# Patient Record
Sex: Male | Born: 1960
Health system: Southern US, Community
[De-identification: ages and names within clinical notes are randomized; demographics above are authoritative.]

## PROBLEM LIST (undated history)

## (undated) DIAGNOSIS — Z87442 Personal history of urinary calculi: Secondary | ICD-10-CM

## (undated) DIAGNOSIS — I4819 Other persistent atrial fibrillation: Secondary | ICD-10-CM

## (undated) DIAGNOSIS — E782 Mixed hyperlipidemia: Secondary | ICD-10-CM

## (undated) DIAGNOSIS — I2510 Atherosclerotic heart disease of native coronary artery without angina pectoris: Secondary | ICD-10-CM

## (undated) DIAGNOSIS — F419 Anxiety disorder, unspecified: Secondary | ICD-10-CM

## (undated) HISTORY — PX: COLONOSCOPY: SHX174

## (undated) HISTORY — DX: Atherosclerotic heart disease of native coronary artery without angina pectoris: I25.10

## (undated) HISTORY — DX: Mixed hyperlipidemia: E78.2

## (undated) HISTORY — PX: CARDIAC CATHETERIZATION: SHX172

## (undated) HISTORY — PX: OTHER SURGICAL HISTORY: SHX169

## (undated) HISTORY — PX: POLYPECTOMY: SHX149

## (undated) HISTORY — PX: CORONARY ARTERY BYPASS GRAFT: SHX141

## (undated) HISTORY — DX: Other persistent atrial fibrillation: I48.19

## (undated) HISTORY — DX: Personal history of urinary calculi: Z87.442

---

## 1999-03-10 ENCOUNTER — Encounter: Payer: Self-pay | Admitting: Orthopedic Surgery

## 1999-03-10 ENCOUNTER — Ambulatory Visit (HOSPITAL_COMMUNITY): Admission: RE | Admit: 1999-03-10 | Discharge: 1999-03-10 | Payer: Self-pay | Admitting: Orthopedic Surgery

## 2001-09-03 ENCOUNTER — Emergency Department (HOSPITAL_COMMUNITY): Admission: EM | Admit: 2001-09-03 | Discharge: 2001-09-03 | Payer: Self-pay | Admitting: Emergency Medicine

## 2001-09-03 ENCOUNTER — Encounter: Payer: Self-pay | Admitting: Emergency Medicine

## 2001-09-05 ENCOUNTER — Ambulatory Visit (HOSPITAL_BASED_OUTPATIENT_CLINIC_OR_DEPARTMENT_OTHER): Admission: RE | Admit: 2001-09-05 | Discharge: 2001-09-05 | Payer: Self-pay | Admitting: Urology

## 2001-09-05 ENCOUNTER — Encounter: Payer: Self-pay | Admitting: Urology

## 2004-10-06 ENCOUNTER — Ambulatory Visit: Payer: Self-pay | Admitting: Internal Medicine

## 2004-10-13 ENCOUNTER — Ambulatory Visit: Payer: Self-pay | Admitting: Internal Medicine

## 2004-12-06 ENCOUNTER — Ambulatory Visit: Payer: Self-pay | Admitting: Internal Medicine

## 2006-05-31 ENCOUNTER — Ambulatory Visit: Payer: Self-pay | Admitting: Family Medicine

## 2006-07-30 ENCOUNTER — Ambulatory Visit: Payer: Self-pay | Admitting: Internal Medicine

## 2006-08-17 ENCOUNTER — Emergency Department (HOSPITAL_COMMUNITY): Admission: EM | Admit: 2006-08-17 | Discharge: 2006-08-17 | Payer: Self-pay | Admitting: Family Medicine

## 2007-01-29 ENCOUNTER — Encounter: Payer: Self-pay | Admitting: Internal Medicine

## 2007-03-14 LAB — CONVERTED CEMR LAB
Bilirubin, Direct: 0.2 mg/dL (ref 0.0–0.3)
Chol/HDL Ratio, serum: 3.9
HDL: 34.9 mg/dL — ABNORMAL LOW (ref >39.0)
LDL Cholesterol: 89 mg/dL (ref 0–99)
Triglyceride fasting, serum: 67 mg/dL (ref 0–149)
VLDL: 13 mg/dL (ref 0–40)

## 2007-08-21 ENCOUNTER — Telehealth: Payer: Self-pay | Admitting: Internal Medicine

## 2007-08-29 ENCOUNTER — Ambulatory Visit: Payer: Self-pay | Admitting: Internal Medicine

## 2007-08-29 DIAGNOSIS — Z87442 Personal history of urinary calculi: Secondary | ICD-10-CM

## 2007-08-29 DIAGNOSIS — E782 Mixed hyperlipidemia: Secondary | ICD-10-CM

## 2007-08-29 HISTORY — DX: Mixed hyperlipidemia: E78.2

## 2007-08-29 HISTORY — DX: Personal history of urinary calculi: Z87.442

## 2007-08-29 LAB — CONVERTED CEMR LAB
Cholesterol, target level: 200 mg/dL
HDL goal, serum: 40 mg/dL

## 2007-08-30 LAB — CONVERTED CEMR LAB
ALT: 60 units/L — ABNORMAL HIGH (ref 0–53)
AST: 26 units/L (ref 0–37)
Bilirubin, Direct: 0.2 mg/dL (ref 0.0–0.3)
CO2: 29 meq/L (ref 19–32)
Calcium: 10.4 mg/dL (ref 8.4–10.5)
Chloride: 104 meq/L (ref 96–112)
Cholesterol: 172 mg/dL (ref 0–200)
GFR calc non Af Amer: 96 mL/min
Glucose, Bld: 93 mg/dL (ref 70–99)
LDL Cholesterol: 112 mg/dL — ABNORMAL HIGH (ref 0–99)

## 2007-09-02 ENCOUNTER — Telehealth (INDEPENDENT_AMBULATORY_CARE_PROVIDER_SITE_OTHER): Payer: Self-pay | Admitting: *Deleted

## 2007-10-21 ENCOUNTER — Encounter: Payer: Self-pay | Admitting: Internal Medicine

## 2008-08-14 DIAGNOSIS — I2510 Atherosclerotic heart disease of native coronary artery without angina pectoris: Secondary | ICD-10-CM

## 2008-08-14 HISTORY — DX: Atherosclerotic heart disease of native coronary artery without angina pectoris: I25.10

## 2008-09-21 ENCOUNTER — Ambulatory Visit: Payer: Self-pay | Admitting: Internal Medicine

## 2008-09-22 LAB — CONVERTED CEMR LAB
ALT: 30 units/L (ref 0–53)
Bilirubin, Direct: 0.1 mg/dL (ref 0.0–0.3)
LDL Cholesterol: 89 mg/dL (ref 0–99)
Total Bilirubin: 1 mg/dL (ref 0.3–1.2)
Total CHOL/HDL Ratio: 3.4
VLDL: 9 mg/dL (ref 0–40)

## 2009-02-17 ENCOUNTER — Telehealth: Payer: Self-pay | Admitting: Internal Medicine

## 2009-03-04 ENCOUNTER — Ambulatory Visit: Payer: Self-pay | Admitting: Internal Medicine

## 2009-03-04 ENCOUNTER — Ambulatory Visit: Payer: Self-pay | Admitting: Family Medicine

## 2009-03-05 LAB — CONVERTED CEMR LAB
Albumin: 3.8 g/dL (ref 3.5–5.2)
HDL: 37.7 mg/dL — ABNORMAL LOW (ref >39.00)
LDL Cholesterol: 73 mg/dL (ref 0–99)
Total CHOL/HDL Ratio: 3
Triglycerides: 102 mg/dL (ref 0.0–149.0)
VLDL: 20.4 mg/dL (ref 0.0–40.0)

## 2009-05-20 ENCOUNTER — Ambulatory Visit: Payer: Self-pay | Admitting: Internal Medicine

## 2009-05-20 LAB — CONVERTED CEMR LAB
Albumin: 4.4 g/dL (ref 3.5–5.2)
Alkaline Phosphatase: 52 units/L (ref 39–117)
Basophils Absolute: 0 10*3/uL (ref 0.0–0.1)
Bilirubin Urine: NEGATIVE
CO2: 30 meq/L (ref 19–32)
Calcium: 9.4 mg/dL (ref 8.4–10.5)
Chloride: 103 meq/L (ref 96–112)
Creatinine, Ser: 0.9 mg/dL (ref 0.4–1.5)
Eosinophils Absolute: 0.1 10*3/uL (ref 0.0–0.7)
Glucose, Bld: 84 mg/dL (ref 70–99)
Glucose, Urine, Semiquant: NEGATIVE
HDL: 43.2 mg/dL (ref >39.00)
Hemoglobin: 16.2 g/dL (ref 13.0–17.0)
Ketones, urine, test strip: NEGATIVE
Lymphocytes Relative: 28.2 % (ref 12.0–46.0)
MCHC: 33.7 g/dL (ref 30.0–36.0)
MCV: 91.1 fL (ref 78.0–100.0)
Monocytes Absolute: 0.8 10*3/uL (ref 0.1–1.0)
Neutro Abs: 3.3 10*3/uL (ref 1.4–7.7)
Neutrophils Relative %: 55.6 % (ref 43.0–77.0)
PSA: 0.55 ng/mL (ref 0.10–4.00)
RDW: 12.4 % (ref 11.5–14.6)
Specific Gravity, Urine: 1.02
Total Protein: 7.2 g/dL (ref 6.0–8.3)
Triglycerides: 127 mg/dL (ref 0.0–149.0)
pH: 6.5

## 2009-06-03 ENCOUNTER — Ambulatory Visit: Payer: Self-pay | Admitting: Internal Medicine

## 2009-06-03 DIAGNOSIS — R079 Chest pain, unspecified: Secondary | ICD-10-CM | POA: Insufficient documentation

## 2009-06-07 ENCOUNTER — Telehealth (INDEPENDENT_AMBULATORY_CARE_PROVIDER_SITE_OTHER): Payer: Self-pay | Admitting: *Deleted

## 2009-06-08 ENCOUNTER — Ambulatory Visit: Payer: Self-pay | Admitting: Cardiovascular Disease

## 2009-06-08 ENCOUNTER — Encounter: Payer: Self-pay | Admitting: Cardiovascular Disease

## 2009-06-08 ENCOUNTER — Encounter (INDEPENDENT_AMBULATORY_CARE_PROVIDER_SITE_OTHER): Payer: Self-pay | Admitting: *Deleted

## 2009-06-08 ENCOUNTER — Ambulatory Visit: Payer: Self-pay

## 2009-06-08 ENCOUNTER — Encounter (HOSPITAL_COMMUNITY): Admission: RE | Admit: 2009-06-08 | Discharge: 2009-08-11 | Payer: Self-pay | Admitting: Internal Medicine

## 2009-06-08 LAB — CONVERTED CEMR LAB
Basophils Absolute: 0 10*3/uL (ref 0.0–0.1)
Basophils Relative: 0.2 % (ref 0.0–3.0)
Calcium: 9.4 mg/dL (ref 8.4–10.5)
Eosinophils Relative: 1.8 % (ref 0.0–5.0)
GFR calc non Af Amer: 108.6 mL/min (ref >60)
Glucose, Bld: 88 mg/dL (ref 70–99)
HCT: 47.8 % (ref 39.0–52.0)
Hemoglobin: 16.1 g/dL (ref 13.0–17.0)
Lymphocytes Relative: 32 % (ref 12.0–46.0)
Lymphs Abs: 1.8 10*3/uL (ref 0.7–4.0)
Monocytes Relative: 11.3 % (ref 3.0–12.0)
Neutro Abs: 3 10*3/uL (ref 1.4–7.7)
Potassium: 4.1 meq/L (ref 3.5–5.1)
RBC: 5.23 M/uL (ref 4.22–5.81)
Sodium: 139 meq/L (ref 135–145)
WBC: 5.5 10*3/uL (ref 4.5–10.5)

## 2009-06-09 ENCOUNTER — Encounter: Payer: Self-pay | Admitting: Cardiovascular Disease

## 2009-06-09 ENCOUNTER — Ambulatory Visit (HOSPITAL_COMMUNITY): Admission: RE | Admit: 2009-06-09 | Discharge: 2009-06-09 | Payer: Self-pay | Admitting: Cardiovascular Disease

## 2009-06-15 ENCOUNTER — Ambulatory Visit: Payer: Self-pay | Admitting: Cardiothoracic Surgery

## 2009-06-16 ENCOUNTER — Ambulatory Visit (HOSPITAL_COMMUNITY): Admission: RE | Admit: 2009-06-16 | Discharge: 2009-06-16 | Payer: Self-pay | Admitting: Cardiothoracic Surgery

## 2009-06-16 ENCOUNTER — Telehealth (INDEPENDENT_AMBULATORY_CARE_PROVIDER_SITE_OTHER): Payer: Self-pay | Admitting: *Deleted

## 2009-06-16 ENCOUNTER — Encounter: Payer: Self-pay | Admitting: Cardiothoracic Surgery

## 2009-06-17 ENCOUNTER — Telehealth: Payer: Self-pay | Admitting: Cardiovascular Disease

## 2009-06-18 ENCOUNTER — Ambulatory Visit: Payer: Self-pay | Admitting: Cardiothoracic Surgery

## 2009-06-18 ENCOUNTER — Inpatient Hospital Stay (HOSPITAL_COMMUNITY): Admission: RE | Admit: 2009-06-18 | Discharge: 2009-06-21 | Payer: Self-pay | Admitting: Cardiothoracic Surgery

## 2009-06-28 ENCOUNTER — Ambulatory Visit: Payer: Self-pay | Admitting: Cardiovascular Disease

## 2009-06-28 DIAGNOSIS — I959 Hypotension, unspecified: Secondary | ICD-10-CM | POA: Insufficient documentation

## 2009-06-28 DIAGNOSIS — I319 Disease of pericardium, unspecified: Secondary | ICD-10-CM | POA: Insufficient documentation

## 2009-07-02 ENCOUNTER — Ambulatory Visit: Payer: Self-pay

## 2009-07-02 ENCOUNTER — Encounter: Payer: Self-pay | Admitting: Cardiovascular Disease

## 2009-07-02 ENCOUNTER — Encounter (INDEPENDENT_AMBULATORY_CARE_PROVIDER_SITE_OTHER): Payer: Self-pay | Admitting: *Deleted

## 2009-07-02 ENCOUNTER — Ambulatory Visit (HOSPITAL_COMMUNITY): Admission: RE | Admit: 2009-07-02 | Discharge: 2009-07-02 | Payer: Self-pay | Admitting: Cardiovascular Disease

## 2009-07-02 ENCOUNTER — Ambulatory Visit: Payer: Self-pay | Admitting: Cardiology

## 2009-07-06 ENCOUNTER — Encounter (HOSPITAL_COMMUNITY): Admission: RE | Admit: 2009-07-06 | Discharge: 2009-08-13 | Payer: Self-pay | Admitting: Cardiovascular Disease

## 2009-07-06 ENCOUNTER — Encounter: Payer: Self-pay | Admitting: Cardiovascular Disease

## 2009-07-12 ENCOUNTER — Encounter (INDEPENDENT_AMBULATORY_CARE_PROVIDER_SITE_OTHER): Payer: Self-pay | Admitting: *Deleted

## 2009-07-12 ENCOUNTER — Encounter: Payer: Self-pay | Admitting: Cardiovascular Disease

## 2009-07-12 ENCOUNTER — Ambulatory Visit: Payer: Self-pay | Admitting: Cardiothoracic Surgery

## 2009-07-12 ENCOUNTER — Encounter: Admission: RE | Admit: 2009-07-12 | Discharge: 2009-07-12 | Payer: Self-pay | Admitting: Cardiothoracic Surgery

## 2009-07-19 ENCOUNTER — Telehealth: Payer: Self-pay | Admitting: Cardiovascular Disease

## 2009-07-21 ENCOUNTER — Encounter: Payer: Self-pay | Admitting: Cardiovascular Disease

## 2009-08-14 ENCOUNTER — Encounter (HOSPITAL_COMMUNITY): Admission: RE | Admit: 2009-08-14 | Discharge: 2009-11-12 | Payer: Self-pay | Admitting: Cardiovascular Disease

## 2009-08-19 ENCOUNTER — Encounter (INDEPENDENT_AMBULATORY_CARE_PROVIDER_SITE_OTHER): Payer: Self-pay | Admitting: *Deleted

## 2009-08-19 ENCOUNTER — Ambulatory Visit: Payer: Self-pay | Admitting: Cardiovascular Disease

## 2009-08-19 LAB — CONVERTED CEMR LAB
Albumin: 3.9 g/dL (ref 3.5–5.2)
Alkaline Phosphatase: 58 units/L (ref 39–117)
Cholesterol: 116 mg/dL (ref 0–200)
HDL: 40.9 mg/dL (ref >39.00)
LDL Cholesterol: 61 mg/dL (ref 0–99)
Total CHOL/HDL Ratio: 3
Total Protein: 6.7 g/dL (ref 6.0–8.3)
Triglycerides: 69 mg/dL (ref 0.0–149.0)

## 2009-08-23 ENCOUNTER — Encounter: Payer: Self-pay | Admitting: Cardiovascular Disease

## 2009-09-23 ENCOUNTER — Telehealth: Payer: Self-pay | Admitting: Cardiovascular Disease

## 2009-10-11 ENCOUNTER — Telehealth: Payer: Self-pay | Admitting: Cardiovascular Disease

## 2009-10-12 ENCOUNTER — Encounter (INDEPENDENT_AMBULATORY_CARE_PROVIDER_SITE_OTHER): Payer: Self-pay | Admitting: *Deleted

## 2009-11-02 ENCOUNTER — Ambulatory Visit: Payer: Self-pay

## 2009-11-02 ENCOUNTER — Telehealth: Payer: Self-pay | Admitting: *Deleted

## 2009-11-02 ENCOUNTER — Ambulatory Visit: Payer: Self-pay | Admitting: Cardiovascular Disease

## 2010-01-13 ENCOUNTER — Encounter: Payer: Self-pay | Admitting: Cardiovascular Disease

## 2010-01-26 ENCOUNTER — Ambulatory Visit: Payer: Self-pay | Admitting: Cardiovascular Disease

## 2010-01-28 ENCOUNTER — Encounter (INDEPENDENT_AMBULATORY_CARE_PROVIDER_SITE_OTHER): Payer: Self-pay | Admitting: *Deleted

## 2010-01-28 LAB — CONVERTED CEMR LAB
ALT: 18 units/L (ref 0–53)
AST: 19 units/L (ref 0–37)
Bilirubin, Direct: 0.2 mg/dL (ref 0.0–0.3)
Cholesterol: 143 mg/dL (ref 0–200)
HDL: 56.5 mg/dL (ref >39.00)
Total Bilirubin: 0.9 mg/dL (ref 0.3–1.2)
Total Protein: 6.7 g/dL (ref 6.0–8.3)
VLDL: 10.4 mg/dL (ref 0.0–40.0)

## 2010-04-08 ENCOUNTER — Ambulatory Visit: Payer: Self-pay | Admitting: Internal Medicine

## 2010-04-08 DIAGNOSIS — J069 Acute upper respiratory infection, unspecified: Secondary | ICD-10-CM | POA: Insufficient documentation

## 2010-05-31 ENCOUNTER — Ambulatory Visit: Payer: Self-pay | Admitting: Cardiovascular Disease

## 2010-08-01 ENCOUNTER — Telehealth: Payer: Self-pay | Admitting: Internal Medicine

## 2010-09-15 NOTE — Letter (Signed)
Summary: [facility]  [facility]   Imported By: Kassie Mends 08/30/2009 08:50:31  _____________________________________________________________________  External Attachment:    Type:   Image     Comment:   External Document

## 2010-09-15 NOTE — Progress Notes (Signed)
Summary: Pt [facility] Note Refill Request Call back at [phone] Ann's cell Message from:  spouse Dewayne Hatch on August 01, 2010 2:31 PM  Refills Requested: Medication #1:  LIPITOR 10 MG  TABS Take 1 tablet by mouth at bedtime   Dosage confirmed as above?Dosage Confirmed Pt has been getting this through [facility], but pt would like to get this called in to local pharmacy. [facility]s [facility] in to [facility].      Method Requested: [facility]  Initial call taken by: Lucy Antigua,  August 01, 2010 2:30 PM  Follow-up for Phone Call        Rx called to pharmacy Follow-up by: Alfred Levins, CMA,  August 01, 2010 3:39 PM    Prescriptions: LIPITOR 10 MG  TABS (ATORVASTATIN CALCIUM) Take 1 tablet by mouth at bedtime  #90 x 2   Entered by:   Alfred Levins, CMA   Authorized by:   Birdie Sons MD   Signed by:   Alfred Levins, CMA on 08/01/2010   Method used:   Electronically to        [facility] [address] [phone]* (retail)       [address]       [place], [place]  [address]       Ph: [phone] or [phone]       Fax: [phone]   RxID:   [id number]

## 2010-09-15 NOTE — Miscellaneous (Signed)
Summary: [facility]   [facility]   Imported By: Roderic Ovens 01/31/2010 15:49:08  _____________________________________________________________________  External Attachment:    Type:   Image     Comment:   External Document

## 2010-09-15 NOTE — Miscellaneous (Signed)
Summary: [facility]   [facility]   Imported By: Roderic Ovens 09/02/2009 13:43:47  _____________________________________________________________________  External Attachment:    Type:   Image     Comment:   External Document

## 2010-09-15 NOTE — Assessment & Plan Note (Signed)
Summary: 6 month rov/sl   Visit Type:  Follow-up  CC:  NO COMPLAINTS AT THIS TIME.  History of Present Illness: Travis Cook is seen today post CABG by Dr Tyrone Sage.  He is doing well.  His BP and HR always run low and I think we can stop his BB all together now.  He has been taking an aspirin.  His sternum has healed well.  He is not having angina, palpitations, dyspnea or edema.  F/U ETT 3/11 showed normal BP and HR response.  Lipids followed by Dr Cato Mulligan have been good with normal LFTS'  He has a lot of traveling to do this month.    Current Problems (verified): 1)  Uri  (ICD-465.9) 2)  Encounter For Long-term Use of Other Medications  (ICD-V58.69) 3)  Low Blood Pressure  (ICD-458.9) 4)  Pericardial Effusion  (ICD-423.9) 5)  Hyperlipidemia  (ICD-272.2) 6)  Chest Pain  (ICD-786.50) 7)  Preventive Health Care  (ICD-V70.0) 8)  Nephrolithiasis, Hx of  (ICD-V13.01) 9)  Pre-operative Cardiovascular Examination  (ICD-V72.81)  Current Medications (verified): 1)  Lipitor 10 Mg  Tabs (Atorvastatin Calcium) .... Take 1 Tablet By Mouth At Bedtime 2)  Aspirin Ec 325 Mg Tbec (Aspirin) .... Take One Tablet By Mouth Daily 3)  Tylenol 325 Mg Tabs (Acetaminophen) .... 4 Times Daily As Needed 4)  Zolpidem Tartrate 5 Mg  Tabs (Zolpidem Tartrate) .[name].. 1 By Mouth At Night As Needed  Allergies (verified): No Known Drug Allergies  Past History:  Past Medical History: Last updated: 06/23/2009 Current Problems:  HYPERLIPIDEMIA (ICD-272.2) Angina:  CABG 06/2009 PREVENTIVE HEALTH CARE (ICD-V70.0) NEPHROLITHIASIS, HX OF (ICD-V13.01), hx of--lithotripsy PRE-OPERATIVE CARDIOVASCULAR EXAMINATION (ICD-V72.81)  Past Surgical History: Last updated: 06/23/2009 CABG:  06/21/2009  [name]   Coronary artery bypass grafting x3 using left internal mammary artery to left anterior descending coronary artery, reverse saphenous vein graft to right coronary artery, reverse saphenous vein graft to distal circumflex  coronary artery. Right thigh endovein harvesting done.   ?  meningitis--not diagnostic  Status post hernia repair as an infant.    Status post vocal cord polyp removal.      Family History: Last updated: 08/29/2007 father alive--MI age 22 mother A & W, healthy 3 brothers, 1 sister all A & W  Social History: Last updated: 08/29/2007 Married Never Smoked Regular exercise-yes 4 children--one son with crohns--others healthy  Review of Systems       Denies fever, malais, weight loss, blurry vision, decreased visual acuity, cough, sputum, SOB, hemoptysis, pleuritic pain, palpitaitons, heartburn, abdominal pain, melena, lower extremity edema, claudication, or rash.   Vital Signs:  Patient profile:   50 year old male Height:      68 inches Weight:      157.50 pounds BMI:     24.03 Pulse rate:   57 / minute Pulse rhythm:   regular BP sitting:   105 / 70  (left arm) Cuff size:   regular  Vitals Entered By: Scherrie Bateman, LPN (May 31, 2010 4:09 PM)  Physical Exam  General:  Affect appropriate Healthy:  appears stated age HEENT: normal Neck supple with no adenopathy JVP normal no bruits no thyromegaly Lungs clear with no wheezing and good diaphragmatic motion Heart:  S1/S2 no murmur,rub, gallop or click PMI normal Abdomen: benighn, BS positve, no tenderness, no AAA no bruit.  No HSM or HJR Distal pulses intact with no bruits No edema Neuro non-focal Skin warm and dry    Impression & Recommendations:  Problem #  1:  LOW BLOOD PRESSURE (ICD-458.9) Liberalize salt.  BB stopped.  No evidence of dysautonomia  Problem # 2:  HYPERLIPIDEMIA (ICD-272.2) At goal with statin.  HDL much better His updated medication list for this problem includes:    Lipitor 10 Mg Tabs (Atorvastatin calcium) .[name]... Take 1 tablet by mouth at bedtime  CHOL: 143 (01/26/2010)   LDL: 76 (01/26/2010)   HDL: 56.50 (01/26/2010)   TG: 52.0 (01/26/2010) CHOL (goal): 200 (08/29/2007)   LDL  (goal): 130 (08/29/2007)   HDL (goal): 40 (08/29/2007)   TG (goal): 150 (08/29/2007)  Problem # 3:  CHEST PAIN (ICD-786.50) CAD post CABG  Normal LV continue ASA His updated medication list for this problem includes:    Aspirin Ec 325 Mg Tbec (Aspirin) .[name]... Take one tablet by mouth daily  Patient Instructions: 1)  Your physician recommends that you schedule a follow-up appointment in: year with dr Eden Emms 2)  Your physician recommends that you continue on your current medications as directed. Please refer to the Current Medication list given to you today.

## 2010-09-15 NOTE — Letter (Signed)
[facility]   Imported By: Marylou Mccoy 08/23/2009 13:41:57  _____________________________________________________________________  External Attachment:    Type:   Image     Comment:   External Document

## 2010-09-15 NOTE — Assessment & Plan Note (Signed)
Summary: 6-8wk f/u sl   CC:  follow up.  History of Present Illness: Travis Cook is seen today post CABG by Dr Tyrone Sage.  He is doing well.  His BP and HR always run low and I think we can stop his BB all together now.  He has been taking an aspirin.  His sternum has healed well.  He is not having angina, palpitations, dyspnea or edema.    Current Problems (verified): 1)  Low Blood Pressure  (ICD-458.9) 2)  Pericardial Effusion  (ICD-423.9) 3)  Hyperlipidemia  (ICD-272.2) 4)  Chest Pain  (ICD-786.50) 5)  Preventive Health Care  (ICD-V70.0) 6)  Nephrolithiasis, Hx of  (ICD-V13.01) 7)  Pre-operative Cardiovascular Examination  (ICD-V72.81)  Current Medications (verified): 1)  Lipitor 10 Mg  Tabs (Atorvastatin Calcium) .... Take 1 Tablet By Mouth At Bedtime 2)  Metoprolol Tartrate 25 Mg Tabs (Metoprolol Tartrate) .[name].. 1 Tab By Mouth Once Daily 3)  Aspirin Ec 325 Mg Tbec (Aspirin) .... Take One Tablet By Mouth Daily 4)  Tylenol 325 Mg Tabs (Acetaminophen) .... 4 Times Daily As Needed  Allergies (verified): No Known Drug Allergies  Past History:  Past Medical History: Last updated: 06/23/2009 Current Problems:  HYPERLIPIDEMIA (ICD-272.2) Angina:  CABG 06/2009 PREVENTIVE HEALTH CARE (ICD-V70.0) NEPHROLITHIASIS, HX OF (ICD-V13.01), hx of--lithotripsy PRE-OPERATIVE CARDIOVASCULAR EXAMINATION (ICD-V72.81)  Past Surgical History: Last updated: 06/23/2009 CABG:  06/21/2009  [name]   Coronary artery bypass grafting x3 using left internal mammary artery to left anterior descending coronary artery, reverse saphenous vein graft to right coronary artery, reverse saphenous vein graft to distal circumflex coronary artery. Right thigh endovein harvesting done.   ?  meningitis--not diagnostic  Status post hernia repair as an infant.    Status post vocal cord polyp removal.      Family History: Last updated: 08/29/2007 father alive--MI age 68 mother A & W, healthy 3 brothers, 1 sister  all A & W  Social History: Last updated: 08/29/2007 Married Never Smoked Regular exercise-yes 4 children--one son with crohns--others healthy  Review of Systems       Denies fever, malais, weight loss, blurry vision, decreased visual acuity, cough, sputum, SOB, hemoptysis, pleuritic pain, palpitaitons, heartburn, abdominal pain, melena, lower extremity edema, claudication, or rash.   Vital Signs:  Patient profile:   50 year old male Height:      68 inches Weight:      169 pounds BMI:     25.79 Pulse rate:   52 / minute BP sitting:   93 / 63  (left arm)  Vitals Entered By: Travis Cook (August 19, 2009 9:28 AM)  Physical Exam  General:  Affect appropriate Healthy:  appears stated age HEENT: normal Neck supple with no adenopathy JVP normal no bruits no thyromegaly Lungs clear with no wheezing and good diaphragmatic motion Heart:  S1/S2 no murmur,rub, gallop or click PMI normal Abdomen: benighn, BS positve, no tenderness, no AAA no bruit.  No HSM or HJR Distal pulses intact with no bruits No edema Neuro non-focal Skin warm and dry    Impression & Recommendations:  Problem # 1:  LOW BLOOD PRESSURE (ICD-458.9) Stop BB and keep well hydrated.  Echo showed normal LV funciton and no pericardial effusion  Problem # 2:  CHEST PAIN (ICD-786.50) S/P CABG with good recovery.   His updated medication list for this problem includes:    Metoprolol Tartrate 25 Mg Tabs (Metoprolol tartrate) .[name]... 1 tab by mouth once daily    Aspirin Ec 325 Mg  Tbec (Aspirin) .[name]... Take one tablet by mouth daily  Problem # 3:  PREVENTIVE HEALTH CARE (ICD-V70.0) F/U lipid and liver today. Patient has been primarily vegetarian since surgery.  LDL goal less than 100  Other Orders: TLB-Lipid Panel (80061-LIPID) TLB-Hepatic/Liver Function Pnl (80076-HEPATIC)  Patient Instructions: 1)  Your physician recommends that you schedule a follow-up appointment in: 6 MONTHS

## 2010-09-15 NOTE — Letter (Signed)
[facility]   Imported By: Marylou Mccoy 12/06/2009 12:39:11  _____________________________________________________________________  External Attachment:    Type:   Image     Comment:   External Document

## 2010-09-15 NOTE — Progress Notes (Signed)
Summary: pt want to set up stress test  Phone Note Call from Patient Call back at [phone]   Caller: Spouse/Anne Reason for Call: Talk to Nurse, Talk to Doctor Summary of Call: PER PT SPOUSE CALL CARDIAC REHAB ASKED THEM TO CALL AND SET UP A STRESS TEST TO SEE IF HEART RATE STAYS ABOVE 60 SO THEY CAN GIVE HIM HIS RELEASE Initial call taken by: Omer Jack,  October 11, 2009 11:33 AM  Follow-up for Phone Call        S/W Thurston Hole and per Cardiac Rehab pt needs a stress test. We will need an order from Dr. Eden Emms to proceed. She is aware that he is out of the office today and that we will pass this message on to him and his nurse for f/u.  Follow-up by: Duncan Dull, RN, BSN,  October 11, 2009 11:35 AM    Additional Follow-up for Phone Call Additional follow up Details #2::    OK to set him up for me to do a teadmill in the office Follow-up by: Colon Branch, MD, [facility],  October 11, 2009 4:51 PM

## 2010-09-15 NOTE — Letter (Signed)
Summary: [facility] - Lipid  [facility], Main Office  [address]   [place], [place] [address]   Phone: [phone]  Fax: [phone]     January 28, 2010 MRN: [id number]   Travis Cook [address] [place], [place]  [address]   Dear Mr. Sulkowski,  We have reviewed your cholesterol results.  They are as follows:     Total Cholesterol:    143 (Desirable: less than 200)       HDL  Cholesterol:     56.50  (Desirable: greater than 40 for men and 50 for women)       LDL Cholesterol:       76  (Desirable: less than 100 for low risk and less than 70 for moderate to high risk)       Triglycerides:       52.0  (Desirable: less than 150)  Our recommendations include:These numbers look good. Continue on the same medicine. Liver function is normal. Take care, Dr. Leanora Cover.    Call our office at the number listed above if you have any questions.  Lowering your LDL cholesterol is important, but it is only one of a large number of "risk factors" that may indicate that you are at risk for heart disease, stroke or other complications of hardening of the arteries.  Other risk factors include:   A.  Cigarette Smoking* B.  High Blood Pressure* C.  Obesity* D.   Low HDL Cholesterol (see yours above)* E.   Diabetes Mellitus (higher risk if your is uncontrolled) F.  Family history of premature heart disease G.  Previous history of stroke or cardiovascular disease    *These are risk factors YOU HAVE CONTROL OVER.  For more information, visit .  There is now evidence that lowering the TOTAL CHOLESTEROL AND LDL CHOLESTEROL can reduce the risk of heart disease.  The American Heart Association recommends the following guidelines for the treatment of elevated cholesterol:  1.  If there is now current heart disease and less than two risk factors, TOTAL CHOLESTEROL should be less than 200 and LDL CHOLESTEROL should be less than 100. 2.  If there is current heart disease or two or more  risk factors, TOTAL CHOLESTEROL should be less than 200 and LDL CHOLESTEROL should be less than 70.  A diet low in cholesterol, saturated fat, and calories is the cornerstone of treatment for elevated cholesterol.  Cessation of smoking and exercise are also important in the management of elevated cholesterol and preventing vascular disease.  Studies have shown that 30 to 60 minutes of physical activity most days can help lower blood pressure, lower cholesterol, and keep your weight at a healthy level.  Drug therapy is used when cholesterol levels do not respond to therapeutic lifestyle changes (smoking cessation, diet, and exercise) and remains unacceptably high.  If medication is started, it is important to have you levels checked periodically to evaluate the need for further treatment options.  Thank you,    [facility] Team

## 2010-09-15 NOTE — Assessment & Plan Note (Signed)
Summary: st/njr   Vital Signs:  Patient profile:   50 year old male Weight:      156 pounds Temp:     98.5 degrees F oral BP sitting:   122 / 80  Vitals Entered By: Lynann Beaver CMA (April 08, 2010 10:51 AM) CC: sore throat Is Patient Diabetic? No   CC:  sore throat.  History of Present Illness: ST for one week interferes with sleep no fever or chills no sweats  All other systems reviewed and were negative   Current Medications (verified): 1)  Lipitor 10 Mg  Tabs (Atorvastatin Calcium) .... Take 1 Tablet By Mouth At Bedtime 2)  Aspirin Ec 325 Mg Tbec (Aspirin) .... Take One Tablet By Mouth Daily 3)  Tylenol 325 Mg Tabs (Acetaminophen) .... 4 Times Daily As Needed  Allergies (verified): No Known Drug Allergies  Past History:  Past Medical History: Last updated: 06/23/2009 Current Problems:  HYPERLIPIDEMIA (ICD-272.2) Angina:  CABG 06/2009 PREVENTIVE HEALTH CARE (ICD-V70.0) NEPHROLITHIASIS, HX OF (ICD-V13.01), hx of--lithotripsy PRE-OPERATIVE CARDIOVASCULAR EXAMINATION (ICD-V72.81)  Past Surgical History: Last updated: 06/23/2009 CABG:  06/21/2009  [name]   Coronary artery bypass grafting x3 using left internal mammary artery to left anterior descending coronary artery, reverse saphenous vein graft to right coronary artery, reverse saphenous vein graft to distal circumflex coronary artery. Right thigh endovein harvesting done.   ?  meningitis--not diagnostic  Status post hernia repair as an infant.    Status post vocal cord polyp removal.      Family History: Last updated: 08/29/2007 father alive--MI age 51 mother A & W, healthy 3 brothers, 1 sister all A & W  Social History: Last updated: 08/29/2007 Married Never Smoked Regular exercise-yes 4 children--one son with crohns--others healthy  Risk Factors: Exercise: yes (08/29/2007)  Risk Factors: Smoking Status: never (08/29/2007)  Physical Exam  General:  alert and well-developed.     Head:  normocephalic and atraumatic.   Eyes:  pupils equal and pupils round.   Ears:  R ear normal and L ear normal.   Neck:  No deformities, masses, or tenderness noted. Lungs:  normal respiratory effort and no intercostal retractions.   Heart:  normal rate and regular rhythm.   Abdomen:  soft and non-tender.   Skin:  turgor normal and color normal.     Impression & Recommendations:  Problem # 1:  URI (ICD-465.9) suspect tonsillitis His updated medication list for this problem includes:    Aspirin Ec 325 Mg Tbec (Aspirin) .[name]... Take one tablet by mouth daily    Tylenol 325 Mg Tabs (Acetaminophen) .Marland KitchenMarland KitchenMarland KitchenMarland Kitchen 4 times daily as needed  Complete Medication List: 1)  Lipitor 10 Mg Tabs (Atorvastatin calcium) .... Take 1 tablet by mouth at bedtime 2)  Aspirin Ec 325 Mg Tbec (Aspirin) .... Take one tablet by mouth daily 3)  Tylenol 325 Mg Tabs (Acetaminophen) .... 4 times daily as needed 4)  Cephalexin 500 Mg Tabs (Cephalexin) .... Take 1 tablet by mouth three times a day 5)  Zolpidem Tartrate 5 Mg Tabs (Zolpidem tartrate) .[name].. 1 by mouth at night as needed Prescriptions: ZOLPIDEM TARTRATE 5 MG  TABS (ZOLPIDEM TARTRATE) 1 by mouth at night as needed  #20 x 0   Entered and Authorized by:   Birdie Sons MD   Signed by:   Birdie Sons MD on 04/08/2010   Method used:   Print then Give to Patient   RxID:   [id number] CEPHALEXIN 500 MG  TABS (CEPHALEXIN) Take 1 tablet by mouth  three times a day  #21 x 0   Entered and Authorized by:   Birdie Sons MD   Signed by:   Birdie Sons MD on 04/08/2010   Method used:   [facility] to        [facility] [address] [phone]* (retail)       [address]       [place], [place]  [address]       Ph: [phone] or [phone]       Fax: [phone]   RxID:   [id number]

## 2010-09-15 NOTE — Letter (Signed)
Summary: [facility]  [facility], Main Office  [address]   [place], [place] [address]   Phone: [phone]  Fax: [phone]     August 19, 2009 MRN: [id number]   Travis Cook [address] [place], [place]  [address]   Dear Mr. Spagnuolo,  We have reviewed your cholesterol results.  They are as follows:     Total Cholesterol:    116 (Desirable: less than 200)       HDL  Cholesterol:     40.90  (Desirable: greater than 40 for men and 50 for women)       LDL Cholesterol:       61  (Desirable: less than 100 for low risk and less than 70 for moderate to high risk)       Triglycerides:       69.0  (Desirable: less than 150)  Our recommendations include:These numbers look good. Continue on the same medicine. Liver function is normal. Take care, Dr. Leanora Cover.    Call our office at the number listed above if you have any questions.  Lowering your LDL cholesterol is important, but it is only one of a large number of "risk factors" that may indicate that you are at risk for heart disease, stroke or other complications of hardening of the arteries.  Other risk factors include:   A.  Cigarette Smoking* B.  High Blood Pressure* C.  Obesity* D.   Low HDL Cholesterol (see yours above)* E.   Diabetes Mellitus (higher risk if your is uncontrolled) F.  Family history of premature heart disease G.  Previous history of stroke or cardiovascular disease    *These are risk factors YOU HAVE CONTROL OVER.  For more information, visit .  There is now evidence that lowering the TOTAL CHOLESTEROL AND LDL CHOLESTEROL can reduce the risk of heart disease.  The American Heart Association recommends the following guidelines for the treatment of elevated cholesterol:  1.  If there is now current heart disease and less than two risk factors, TOTAL CHOLESTEROL should be less than 200 and LDL CHOLESTEROL should be less than 100. 2.  If there is current heart disease or two or  more risk factors, TOTAL CHOLESTEROL should be less than 200 and LDL CHOLESTEROL should be less than 70.  A diet low in cholesterol, saturated fat, and calories is the cornerstone of treatment for elevated cholesterol.  Cessation of smoking and exercise are also important in the management of elevated cholesterol and preventing vascular disease.  Studies have shown that 30 to 60 minutes of physical activity most days can help lower blood pressure, lower cholesterol, and keep your weight at a healthy level.  Drug therapy is used when cholesterol levels do not respond to therapeutic lifestyle changes (smoking cessation, diet, and exercise) and remains unacceptably high.  If medication is started, it is important to have you levels checked periodically to evaluate the need for further treatment options.  Thank you,    [facility] [employer] Team

## 2010-09-15 NOTE — Miscellaneous (Signed)
Summary: Orders Update  Clinical Lists Changes  Orders: Added new Referral order of Treadmill (Treadmill) - Signed 

## 2010-09-15 NOTE — Progress Notes (Signed)
Summary: calling about ekg report  Phone Note Call from Patient Call back at Home Phone [phone] Call back at Work Phone [phone]   Caller: Patient Reason for Call: Referral Summary of Call: Pt calling regarding a EKG report that was taken in cardic rehab Initial call taken by: Judie Grieve,  September 23, 2009 2:25 PM  Follow-up for Phone Call        spoke with pt, he is having PAC's in cardiac rehab. he is concerned. all the pt questions were answered Deliah Goody, RN  September 23, 2009 3:05 PM

## 2010-09-15 NOTE — Progress Notes (Signed)
Summary: refill  Phone Note Refill Request Call back at Home Phone [phone] Message from:  spouse----live call  Refills Requested: Medication #1:  LIPITOR 10 MG  TABS Take 1 tablet by mouth at bedtime [facility] fax---[phone].  Initial call taken by: Warnell Forester,  November 02, 2009 1:38 PM    Prescriptions: LIPITOR 10 MG  TABS (ATORVASTATIN CALCIUM) Take 1 tablet by mouth at bedtime  #90 x 2   Entered by:   Kern Reap CMA (AAMA)   Authorized by:   Birdie Sons MD   Signed by:   Kern Reap CMA (AAMA) on 11/02/2009   Method used:   Electronically to        [facility]* (mail-order)             ,          Ph: [phone]       Fax: [phone]   RxID:   [id number]

## 2010-09-22 IMAGING — CR DG CHEST 1V PORT
1 series · 1 of 1 positions shown · non-contrast
Comparison: 06/18/2009

CLINICAL DATA: Coronary bypass, extubated

PORTABLE CHEST - 1 VIEW

[view not recorded]
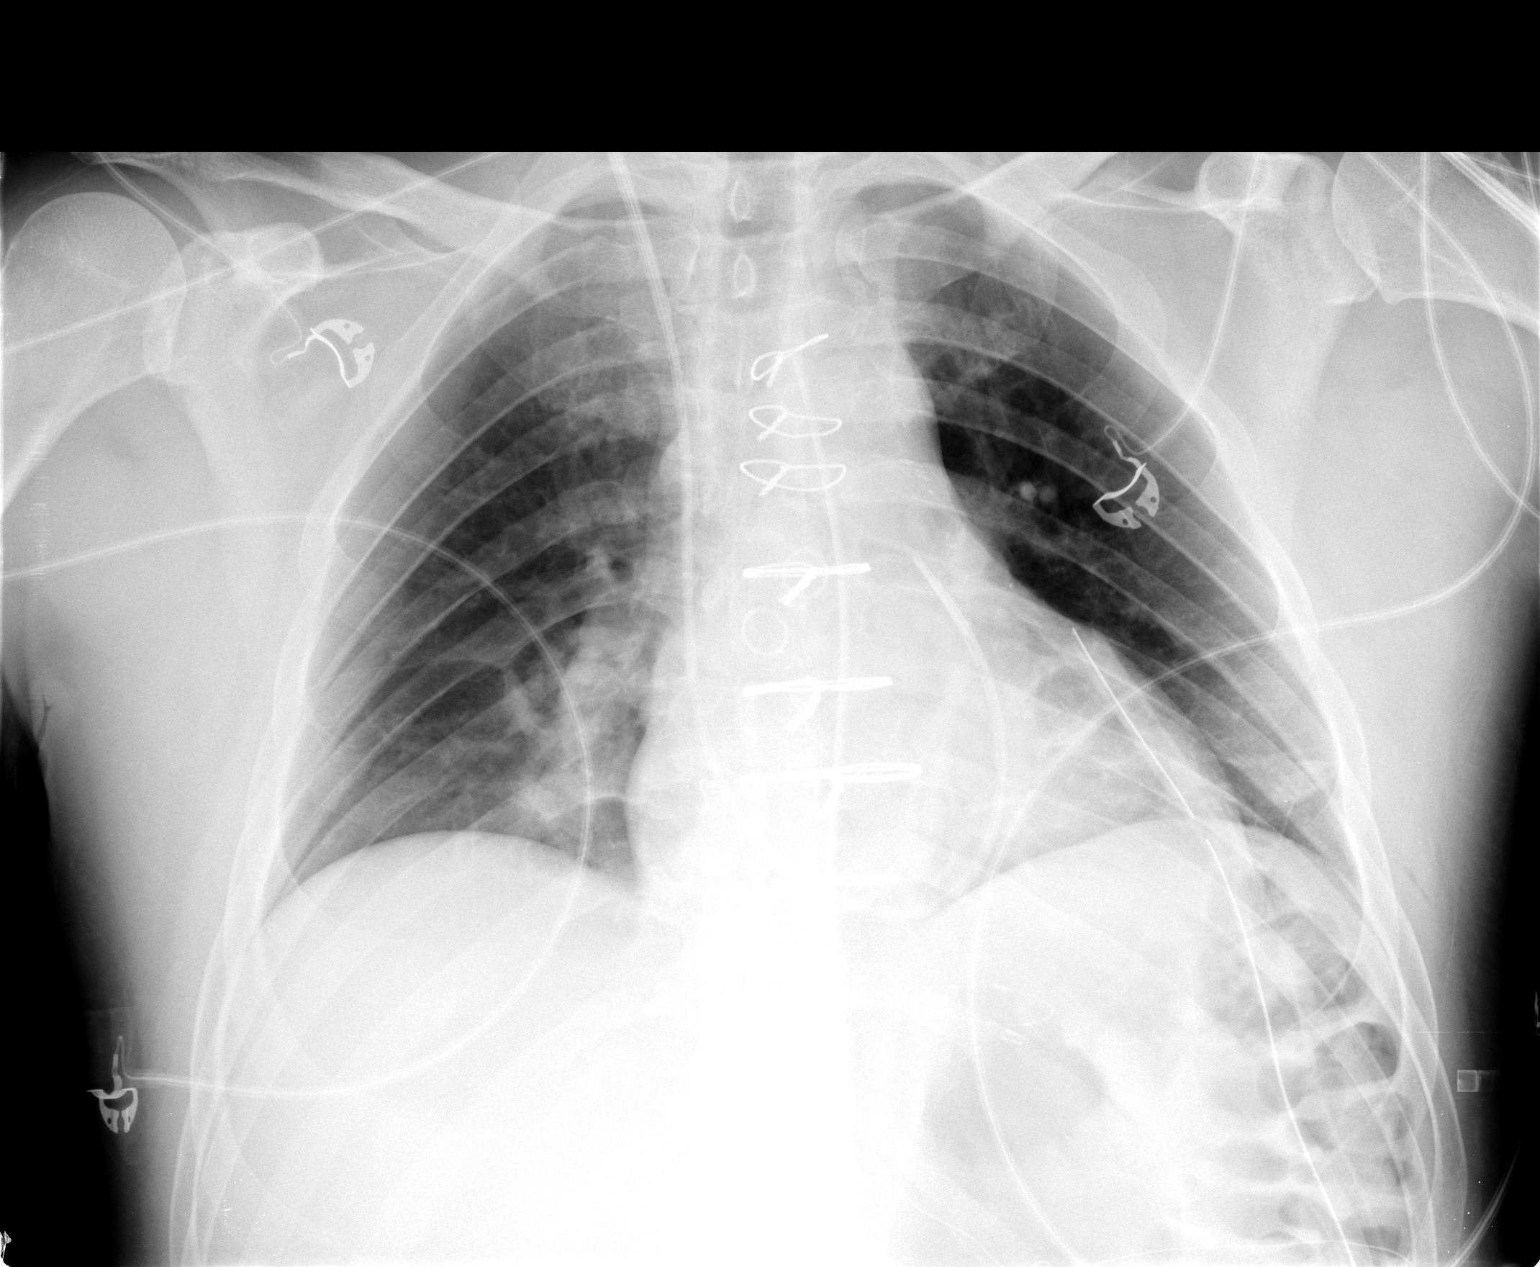

[1 of 1 positions shown; findings below may reference images not displayed]

FINDINGS: Removal of endotracheal tube and NG tube.  Swan-Ganz
catheter remains in central pulmonary outflow tract.  Mediastinal
drain and left chest tube remain.  Low lung volumes persist with
basilar atelectasis.  No large effusion or pneumothorax.  No edema.
Mild cardiac enlargement noted.
IMPRESSION: Persistent basilar atelectasis.  No pneumothorax.

## 2010-09-23 IMAGING — CR DG CHEST 1V PORT
1 series · 1 of 1 positions shown · non-contrast
Comparison: 06/19/2009

CLINICAL DATA: Coronary bypass, follow-up

PORTABLE CHEST - 1 VIEW

[view not recorded]
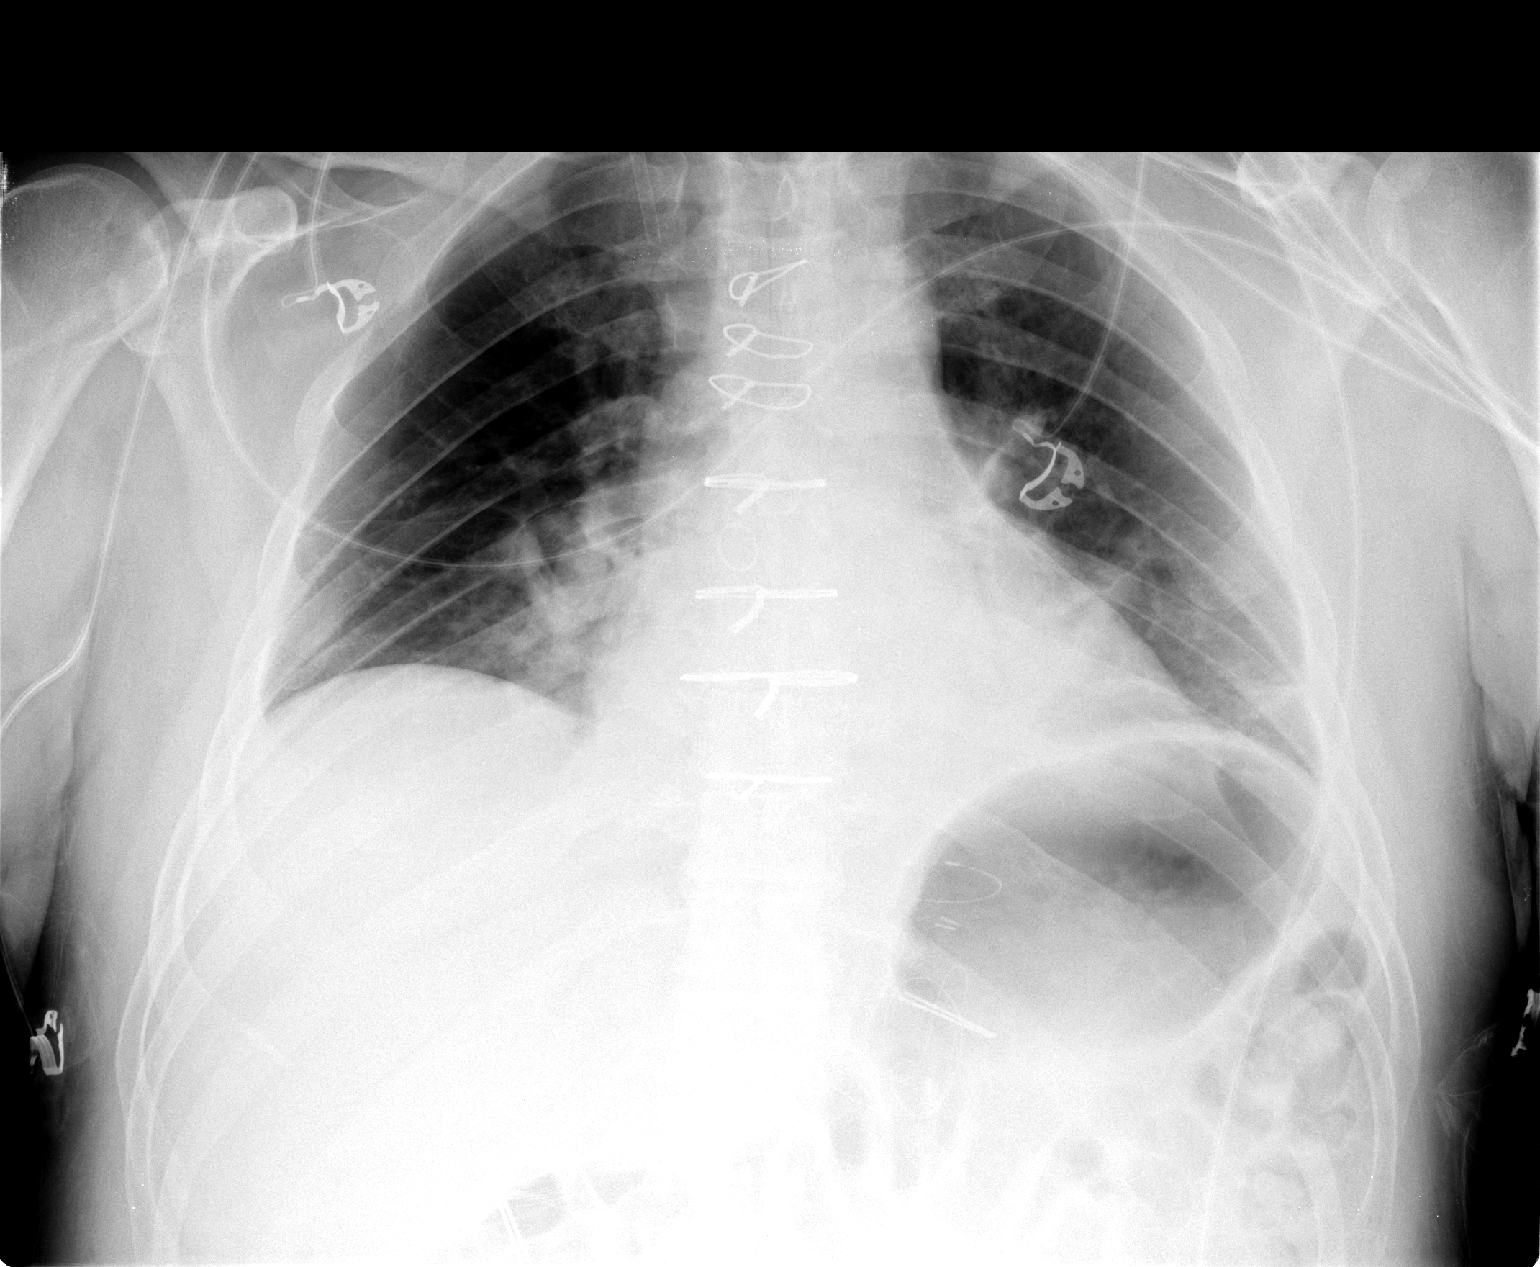

[1 of 1 positions shown; findings below may reference images not displayed]

FINDINGS: Swan-Ganz catheter, mediastinal drain, and left chest
tube have all been removed.  Right IJ vascular sheath remains in
the right innominate vein.  Stable perihilar and lower lobe
atelectasis.  No large effusion, CHF, consolidation, definite
pneumonia, or pneumothorax.  Midline trachea.
IMPRESSION: Stable atelectasis pattern.

## 2010-09-24 IMAGING — CR DG CHEST 2V
2 series · 2 of 2 positions shown · non-contrast
Comparison: 06/20/2009 and earlier.

CLINICAL DATA: 48-year-old male status post CABG.

CHEST - 2 VIEW

[w chest pa]
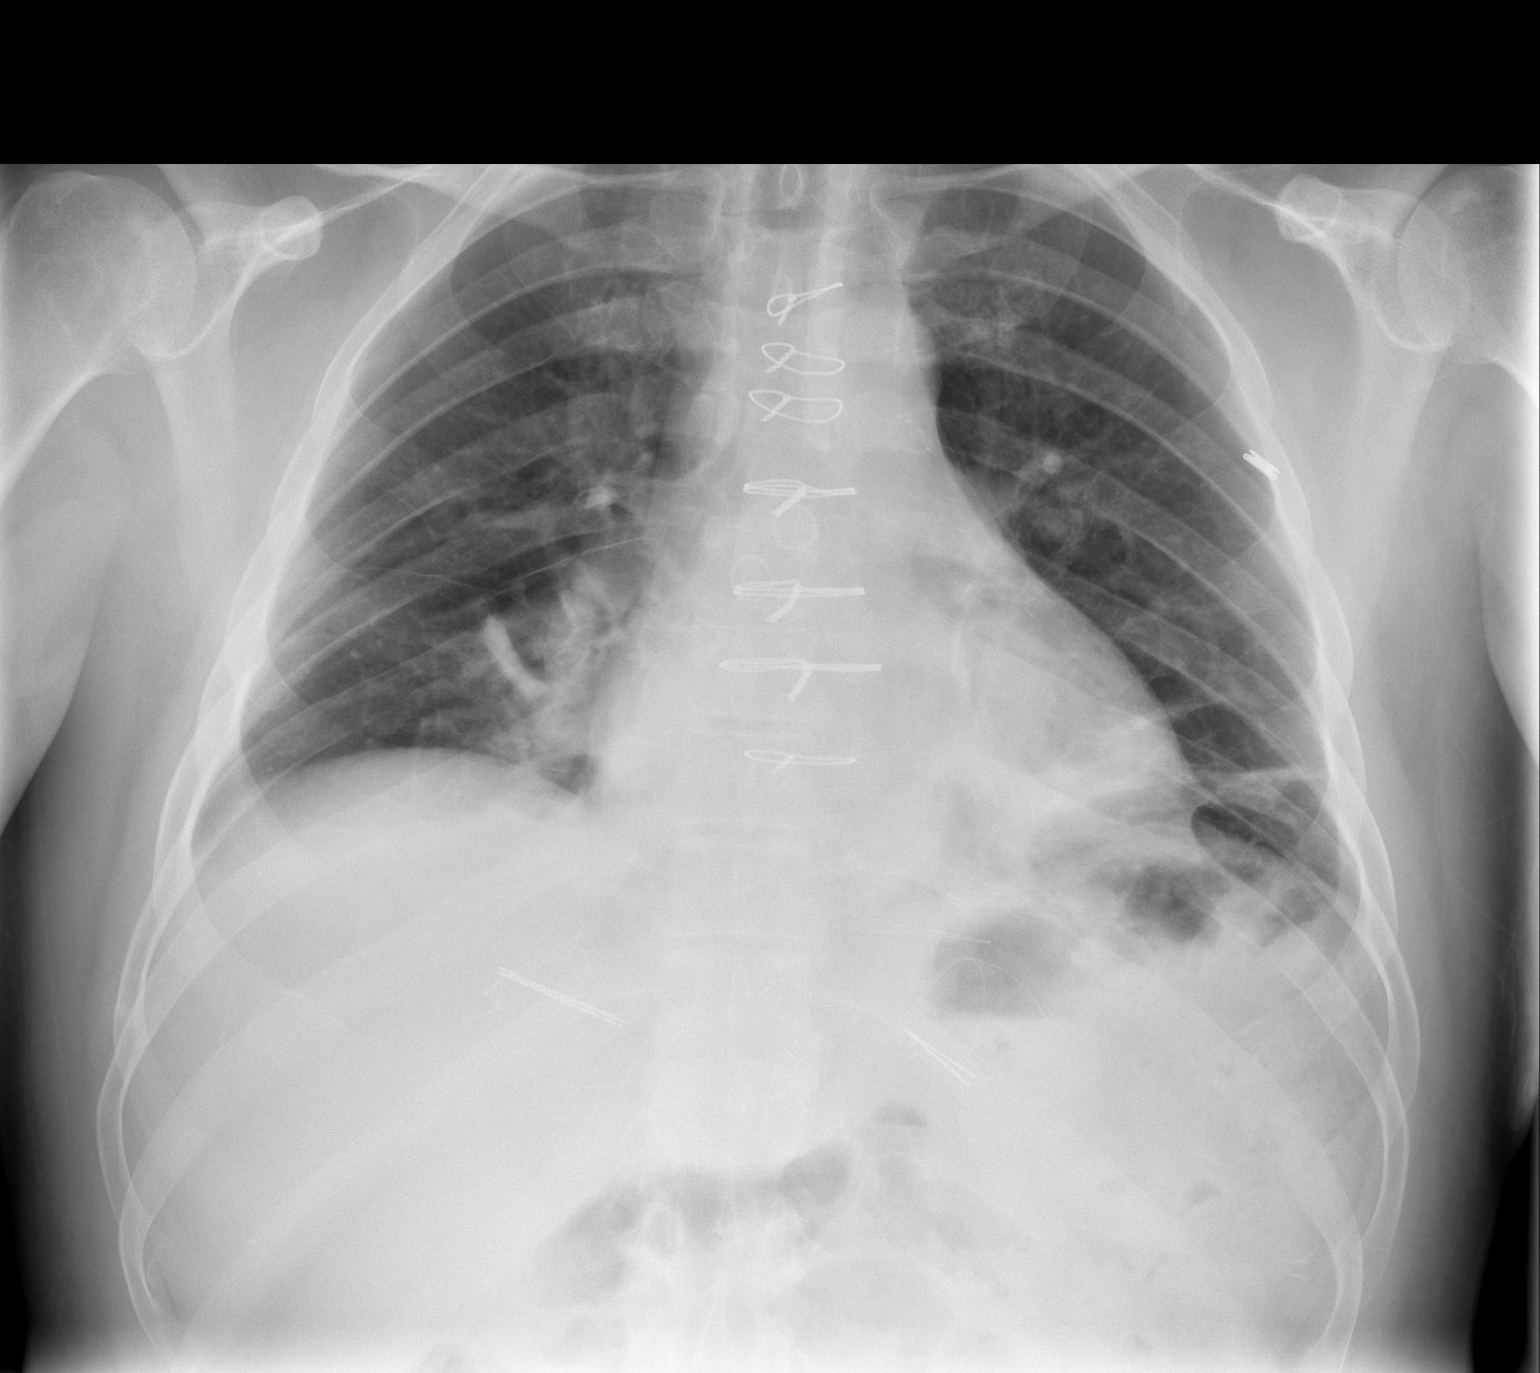

[w chest lat]
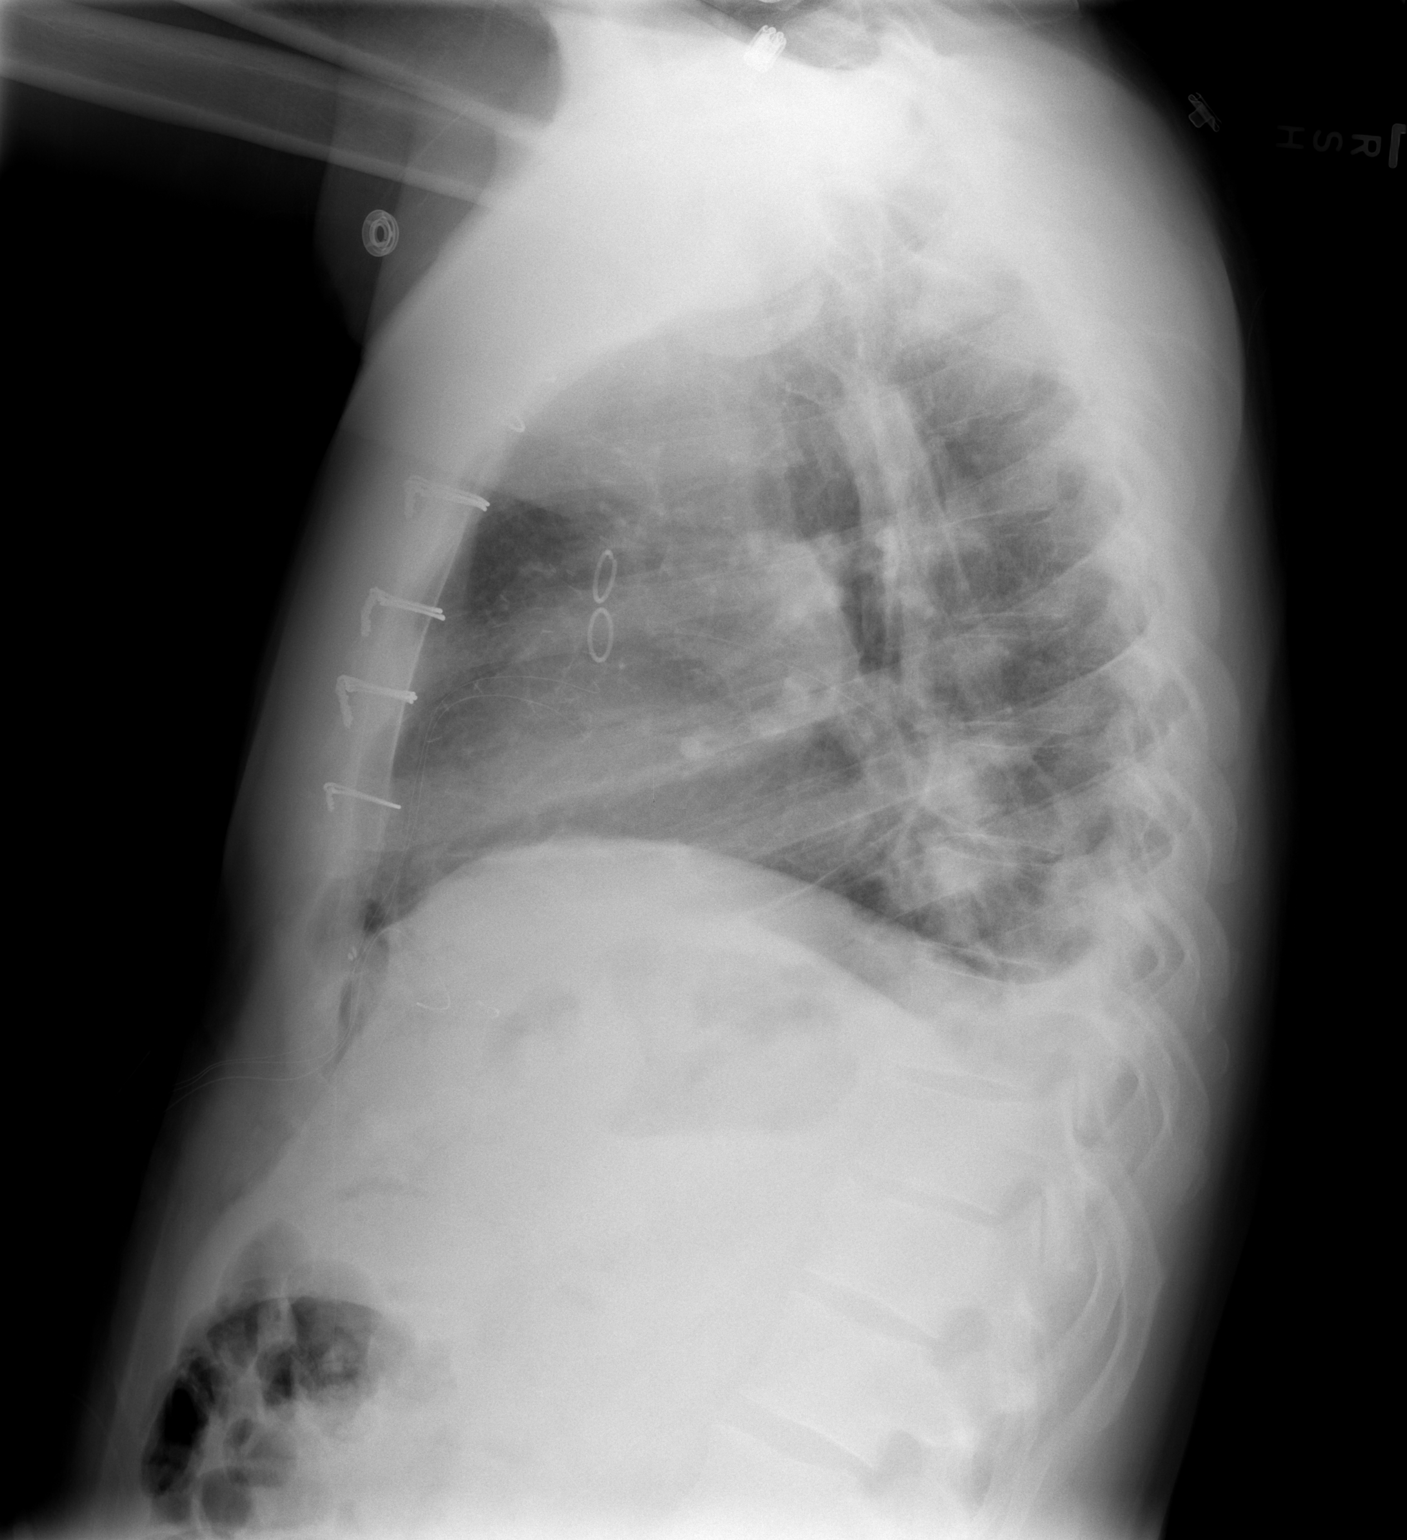

[2 of 2 positions shown; findings below may reference images not displayed]

FINDINGS: Stable cardiac size and mediastinal contours.  Stable
sequelae of CABG and median sternotomy.  Epicardial pacer wires
remain in place.  Bibasilar and infrahilar atelectasis.  Possible
trace effusions.  No pneumothorax or pulmonary edema. Stable
visualized osseous structures.
IMPRESSION: Low lung volumes with atelectasis.  Possible trace effusions.

## 2010-11-16 LAB — CBC
HCT: 29.8 % — ABNORMAL LOW (ref 39.0–52.0)
HCT: 31.8 % — ABNORMAL LOW (ref 39.0–52.0)
HCT: 32.7 % — ABNORMAL LOW (ref 39.0–52.0)
HCT: 34.8 % — ABNORMAL LOW (ref 39.0–52.0)
HCT: 44.5 % (ref 39.0–52.0)
Hemoglobin: 10.4 g/dL — ABNORMAL LOW (ref 13.0–17.0)
Hemoglobin: 10.4 g/dL — ABNORMAL LOW (ref 13.0–17.0)
Hemoglobin: 10.9 g/dL — ABNORMAL LOW (ref 13.0–17.0)
Hemoglobin: 11.3 g/dL — ABNORMAL LOW (ref 13.0–17.0)
Hemoglobin: 11.9 g/dL — ABNORMAL LOW (ref 13.0–17.0)
Hemoglobin: 15.6 g/dL (ref 13.0–17.0)
MCHC: 34.2 g/dL (ref 30.0–36.0)
MCHC: 34.3 g/dL (ref 30.0–36.0)
MCHC: 34.4 g/dL (ref 30.0–36.0)
MCHC: 34.6 g/dL (ref 30.0–36.0)
MCHC: 34.8 g/dL (ref 30.0–36.0)
MCHC: 34.8 g/dL (ref 30.0–36.0)
MCHC: 35.2 g/dL (ref 30.0–36.0)
MCV: 89.5 fL (ref 78.0–100.0)
MCV: 90.5 fL (ref 78.0–100.0)
MCV: 91 fL (ref 78.0–100.0)
MCV: 91.1 fL (ref 78.0–100.0)
MCV: 91.1 fL (ref 78.0–100.0)
MCV: 91.2 fL (ref 78.0–100.0)
Platelets: 102 10*3/uL — ABNORMAL LOW (ref 150–400)
Platelets: 110 10*3/uL — ABNORMAL LOW (ref 150–400)
Platelets: 111 10*3/uL — ABNORMAL LOW (ref 150–400)
Platelets: 123 10*3/uL — ABNORMAL LOW (ref 150–400)
Platelets: 184 10*3/uL (ref 150–400)
Platelets: 93 10*3/uL — ABNORMAL LOW (ref 150–400)
RBC: 3.27 MIL/uL — ABNORMAL LOW (ref 4.22–5.81)
RBC: 3.33 MIL/uL — ABNORMAL LOW (ref 4.22–5.81)
RBC: 3.51 MIL/uL — ABNORMAL LOW (ref 4.22–5.81)
RBC: 3.59 MIL/uL — ABNORMAL LOW (ref 4.22–5.81)
RBC: 3.81 MIL/uL — ABNORMAL LOW (ref 4.22–5.81)
RBC: 4.97 MIL/uL (ref 4.22–5.81)
RDW: 12.4 % (ref 11.5–15.5)
RDW: 12.5 % (ref 11.5–15.5)
RDW: 12.7 % (ref 11.5–15.5)
RDW: 12.8 % (ref 11.5–15.5)
RDW: 12.9 % (ref 11.5–15.5)
RDW: 12.9 % (ref 11.5–15.5)
RDW: 13 % (ref 11.5–15.5)
WBC: 11.4 10*3/uL — ABNORMAL HIGH (ref 4.0–10.5)
WBC: 13.5 10*3/uL — ABNORMAL HIGH (ref 4.0–10.5)
WBC: 13.9 10*3/uL — ABNORMAL HIGH (ref 4.0–10.5)
WBC: 6.5 10*3/uL (ref 4.0–10.5)
WBC: 7.2 10*3/uL (ref 4.0–10.5)

## 2010-11-16 LAB — POCT I-STAT 3, ART BLOOD GAS (G3+)
Acid-Base Excess: 2 mmol/L (ref 0.0–2.0)
Acid-Base Excess: 2 mmol/L (ref 0.0–2.0)
Acid-Base Excess: 3 mmol/L — ABNORMAL HIGH (ref 0.0–2.0)
Acid-base deficit: 1 mmol/L (ref 0.0–2.0)
Bicarbonate: 24.7 mEq/L — ABNORMAL HIGH (ref 20.0–24.0)
Bicarbonate: 24.9 mEq/L — ABNORMAL HIGH (ref 20.0–24.0)
Bicarbonate: 26.9 mEq/L — ABNORMAL HIGH (ref 20.0–24.0)
Bicarbonate: 27.9 mEq/L — ABNORMAL HIGH (ref 20.0–24.0)
Bicarbonate: 28.1 mEq/L — ABNORMAL HIGH (ref 20.0–24.0)
O2 Saturation: 100 %
O2 Saturation: 100 %
O2 Saturation: 100 %
O2 Saturation: 98 %
O2 Saturation: 99 %
Patient temperature: 35.4
Patient temperature: 37.4
TCO2: 26 mmol/L (ref 0–100)
TCO2: 26 mmol/L (ref 0–100)
TCO2: 28 mmol/L (ref 0–100)
TCO2: 29 mmol/L (ref 0–100)
TCO2: 29 mmol/L (ref 0–100)
pCO2 arterial: 35 mmHg (ref 35.0–45.0)
pCO2 arterial: 43.3 mmHg (ref 35.0–45.0)
pCO2 arterial: 43.3 mmHg (ref 35.0–45.0)
pCO2 arterial: 45.3 mmHg — ABNORMAL HIGH (ref 35.0–45.0)
pCO2 arterial: 49.7 mmHg — ABNORMAL HIGH (ref 35.0–45.0)
pH, Arterial: 7.35 (ref 7.350–7.450)
pH, Arterial: 7.358 (ref 7.350–7.450)
pH, Arterial: 7.401 (ref 7.350–7.450)
pH, Arterial: 7.421 (ref 7.350–7.450)
pH, Arterial: 7.45 (ref 7.350–7.450)
pO2, Arterial: 155 mmHg — ABNORMAL HIGH (ref 80.0–100.0)
pO2, Arterial: 264 mmHg — ABNORMAL HIGH (ref 80.0–100.0)
pO2, Arterial: 320 mmHg — ABNORMAL HIGH (ref 80.0–100.0)
pO2, Arterial: 473 mmHg — ABNORMAL HIGH (ref 80.0–100.0)
pO2, Arterial: 98 mmHg (ref 80.0–100.0)

## 2010-11-16 LAB — URINALYSIS, ROUTINE W REFLEX MICROSCOPIC
Bilirubin Urine: NEGATIVE
Glucose, UA: NEGATIVE mg/dL
Hgb urine dipstick: NEGATIVE
Ketones, ur: NEGATIVE mg/dL
Nitrite: NEGATIVE
Protein, ur: NEGATIVE mg/dL
Specific Gravity, Urine: 1.011 (ref 1.005–1.030)
Urobilinogen, UA: 0.2 mg/dL (ref 0.0–1.0)
pH: 8 (ref 5.0–8.0)

## 2010-11-16 LAB — CREATININE, SERUM
Creatinine, Ser: 0.82 mg/dL (ref 0.4–1.5)
Creatinine, Ser: 0.82 mg/dL (ref 0.4–1.5)
GFR calc Af Amer: >60 mL/min (ref >60)
GFR calc Af Amer: >60 mL/min (ref >60)
GFR calc non Af Amer: >60 mL/min (ref >60)
GFR calc non Af Amer: >60 mL/min (ref >60)

## 2010-11-16 LAB — COMPREHENSIVE METABOLIC PANEL
ALT: 32 U/L (ref 0–53)
AST: 21 U/L (ref 0–37)
Albumin: 4.2 g/dL (ref 3.5–5.2)
Alkaline Phosphatase: 65 U/L (ref 39–117)
BUN: 9 mg/dL (ref 6–23)
CO2: 22 mEq/L (ref 19–32)
Calcium: 9.7 mg/dL (ref 8.4–10.5)
Chloride: 106 mEq/L (ref 96–112)
Creatinine, Ser: 0.83 mg/dL (ref 0.4–1.5)
GFR calc Af Amer: >60 mL/min (ref >60)
GFR calc non Af Amer: >60 mL/min (ref >60)
Glucose, Bld: 92 mg/dL (ref 70–99)
Potassium: 4.5 mEq/L (ref 3.5–5.1)
Sodium: 136 mEq/L (ref 135–145)
Total Bilirubin: 1 mg/dL (ref 0.3–1.2)
Total Protein: 6.7 g/dL (ref 6.0–8.3)

## 2010-11-16 LAB — POCT I-STAT 4, (NA,K, GLUC, HGB,HCT)
Glucose, Bld: 108 mg/dL — ABNORMAL HIGH (ref 70–99)
Glucose, Bld: 112 mg/dL — ABNORMAL HIGH (ref 70–99)
Glucose, Bld: 120 mg/dL — ABNORMAL HIGH (ref 70–99)
Glucose, Bld: 127 mg/dL — ABNORMAL HIGH (ref 70–99)
Glucose, Bld: 89 mg/dL (ref 70–99)
Glucose, Bld: 96 mg/dL (ref 70–99)
Glucose, Bld: 98 mg/dL (ref 70–99)
HCT: 29 % — ABNORMAL LOW (ref 39.0–52.0)
HCT: 29 % — ABNORMAL LOW (ref 39.0–52.0)
HCT: 32 % — ABNORMAL LOW (ref 39.0–52.0)
HCT: 32 % — ABNORMAL LOW (ref 39.0–52.0)
HCT: 34 % — ABNORMAL LOW (ref 39.0–52.0)
HCT: 39 % (ref 39.0–52.0)
HCT: 41 % (ref 39.0–52.0)
Hemoglobin: 10.9 g/dL — ABNORMAL LOW (ref 13.0–17.0)
Hemoglobin: 10.9 g/dL — ABNORMAL LOW (ref 13.0–17.0)
Hemoglobin: 11.6 g/dL — ABNORMAL LOW (ref 13.0–17.0)
Hemoglobin: 13.3 g/dL (ref 13.0–17.0)
Hemoglobin: 13.9 g/dL (ref 13.0–17.0)
Hemoglobin: 9.9 g/dL — ABNORMAL LOW (ref 13.0–17.0)
Hemoglobin: 9.9 g/dL — ABNORMAL LOW (ref 13.0–17.0)
Potassium: 3.6 mEq/L (ref 3.5–5.1)
Potassium: 3.8 mEq/L (ref 3.5–5.1)
Potassium: 4.3 mEq/L (ref 3.5–5.1)
Potassium: 4.3 mEq/L (ref 3.5–5.1)
Potassium: 4.4 mEq/L (ref 3.5–5.1)
Potassium: 4.7 mEq/L (ref 3.5–5.1)
Potassium: 5.2 mEq/L — ABNORMAL HIGH (ref 3.5–5.1)
Sodium: 133 mEq/L — ABNORMAL LOW (ref 135–145)
Sodium: 135 mEq/L (ref 135–145)
Sodium: 138 mEq/L (ref 135–145)
Sodium: 138 mEq/L (ref 135–145)
Sodium: 140 mEq/L (ref 135–145)
Sodium: 142 mEq/L (ref 135–145)
Sodium: 142 mEq/L (ref 135–145)

## 2010-11-16 LAB — POCT I-STAT, CHEM 8
BUN: 13 mg/dL (ref 6–23)
BUN: 7 mg/dL (ref 6–23)
Calcium, Ion: 1.24 mmol/L (ref 1.12–1.32)
Calcium, Ion: 1.25 mmol/L (ref 1.12–1.32)
Chloride: 100 mEq/L (ref 96–112)
Chloride: 106 mEq/L (ref 96–112)
Creatinine, Ser: 0.7 mg/dL (ref 0.4–1.5)
Creatinine, Ser: 0.9 mg/dL (ref 0.4–1.5)
Glucose, Bld: 112 mg/dL — ABNORMAL HIGH (ref 70–99)
Glucose, Bld: 139 mg/dL — ABNORMAL HIGH (ref 70–99)
HCT: 32 % — ABNORMAL LOW (ref 39.0–52.0)
HCT: 33 % — ABNORMAL LOW (ref 39.0–52.0)
Hemoglobin: 10.9 g/dL — ABNORMAL LOW (ref 13.0–17.0)
Hemoglobin: 11.2 g/dL — ABNORMAL LOW (ref 13.0–17.0)
Potassium: 4.1 mEq/L (ref 3.5–5.1)
Potassium: 4.7 mEq/L (ref 3.5–5.1)
Sodium: 139 mEq/L (ref 135–145)
Sodium: 142 mEq/L (ref 135–145)
TCO2: 22 mmol/L (ref 0–100)
TCO2: 24 mmol/L (ref 0–100)

## 2010-11-16 LAB — BASIC METABOLIC PANEL
BUN: 8 mg/dL (ref 6–23)
BUN: 9 mg/dL (ref 6–23)
CO2: 18 mEq/L — ABNORMAL LOW (ref 19–32)
CO2: 29 mEq/L (ref 19–32)
CO2: 30 mEq/L (ref 19–32)
Calcium: 7.9 mg/dL — ABNORMAL LOW (ref 8.4–10.5)
Calcium: 8.7 mg/dL (ref 8.4–10.5)
Chloride: 102 mEq/L (ref 96–112)
Chloride: 105 mEq/L (ref 96–112)
Chloride: 106 mEq/L (ref 96–112)
Creatinine, Ser: 0.86 mg/dL (ref 0.4–1.5)
Creatinine, Ser: 0.92 mg/dL (ref 0.4–1.5)
GFR calc Af Amer: >60 mL/min (ref >60)
GFR calc Af Amer: >60 mL/min (ref >60)
GFR calc Af Amer: >60 mL/min (ref >60)
GFR calc non Af Amer: >60 mL/min (ref >60)
GFR calc non Af Amer: >60 mL/min (ref >60)
Glucose, Bld: 106 mg/dL — ABNORMAL HIGH (ref 70–99)
Glucose, Bld: 115 mg/dL — ABNORMAL HIGH (ref 70–99)
Glucose, Bld: 131 mg/dL — ABNORMAL HIGH (ref 70–99)
Potassium: 4.1 mEq/L (ref 3.5–5.1)
Potassium: 4.3 mEq/L (ref 3.5–5.1)
Sodium: 136 mEq/L (ref 135–145)
Sodium: 139 mEq/L (ref 135–145)
Sodium: 142 mEq/L (ref 135–145)

## 2010-11-16 LAB — GLUCOSE, CAPILLARY
Glucose-Capillary: 106 mg/dL — ABNORMAL HIGH (ref 70–99)
Glucose-Capillary: 107 mg/dL — ABNORMAL HIGH (ref 70–99)
Glucose-Capillary: 109 mg/dL — ABNORMAL HIGH (ref 70–99)
Glucose-Capillary: 89 mg/dL (ref 70–99)

## 2010-11-16 LAB — MAGNESIUM
Magnesium: 2.1 mg/dL (ref 1.5–2.5)
Magnesium: 2.2 mg/dL (ref 1.5–2.5)
Magnesium: 2.6 mg/dL — ABNORMAL HIGH (ref 1.5–2.5)

## 2010-11-16 LAB — POCT I-STAT 3, VENOUS BLOOD GAS (G3P V)
Acid-base deficit: 1 mmol/L (ref 0.0–2.0)
Bicarbonate: 24.5 mEq/L — ABNORMAL HIGH (ref 20.0–24.0)
O2 Saturation: 85 %
TCO2: 26 mmol/L (ref 0–100)
pCO2, Ven: 42.5 mmHg — ABNORMAL LOW (ref 45.0–50.0)
pH, Ven: 7.368 — ABNORMAL HIGH (ref 7.250–7.300)
pO2, Ven: 51 mmHg — ABNORMAL HIGH (ref 30.0–45.0)

## 2010-11-16 LAB — PLATELET COUNT: Platelets: 129 10*3/uL — ABNORMAL LOW (ref 150–400)

## 2010-11-16 LAB — BLOOD GAS, ARTERIAL
Acid-base deficit: 0.7 mmol/L (ref 0.0–2.0)
Bicarbonate: 23.2 mEq/L (ref 20.0–24.0)
Drawn by: 181601
FIO2: 0.21 %
O2 Saturation: 97.8 %
Patient temperature: 98.6
TCO2: 24.3 mmol/L (ref 0–100)
pCO2 arterial: 36.9 mmHg (ref 35.0–45.0)
pH, Arterial: 7.415 (ref 7.350–7.450)
pO2, Arterial: 98.4 mmHg (ref 80.0–100.0)

## 2010-11-16 LAB — TYPE AND SCREEN
ABO/RH(D): O POS
Antibody Screen: NEGATIVE

## 2010-11-16 LAB — MRSA CULTURE

## 2010-11-16 LAB — HEMOGLOBIN A1C
Hgb A1c MFr Bld: 5.3 % (ref 4.6–6.1)
Mean Plasma Glucose: 105 mg/dL

## 2010-11-16 LAB — PROTIME-INR
INR: 0.97 (ref 0.00–1.49)
INR: 1.14 (ref 0.00–1.49)
INR: 1.36 (ref 0.00–1.49)
Prothrombin Time: 12.8 seconds (ref 11.6–15.2)
Prothrombin Time: 14.5 seconds (ref 11.6–15.2)
Prothrombin Time: 16.7 seconds — ABNORMAL HIGH (ref 11.6–15.2)

## 2010-11-16 LAB — ABO/RH: ABO/RH(D): O POS

## 2010-11-16 LAB — MRSA PCR SCREENING

## 2010-11-16 LAB — HEMOGLOBIN AND HEMATOCRIT, BLOOD
HCT: 33.1 % — ABNORMAL LOW (ref 39.0–52.0)
Hemoglobin: 11.3 g/dL — ABNORMAL LOW (ref 13.0–17.0)

## 2010-11-16 LAB — APTT: aPTT: 31 seconds (ref 24–37)

## 2010-12-22 ENCOUNTER — Telehealth: Payer: Self-pay | Admitting: Internal Medicine

## 2010-12-22 NOTE — Telephone Encounter (Signed)
Would like to have a referral for a screening colonoscopy due to age.

## 2010-12-23 NOTE — Telephone Encounter (Signed)
ok 

## 2010-12-27 NOTE — Assessment & Plan Note (Signed)
OFFICE VISIT   Travis Cook, Travis Cook  DOB:  1961/04/17                                        July 12, 2009  CHART #:  [id number]   HISTORY:  The patient comes in today for a 3-week follow up.  He is  status post coronary artery bypass grafting x3 on June 18, 2009.  His  postoperative course was uneventful, and he was able to be discharged  home in good condition on postop day #3.  Since his discharge, he has  continued to make progress.  His exercise tolerance is improving, and he  has started riding a stationary bike and walking on the treadmill.  He  is still having some problems with rest, but overall this is improving  as well.  He has been seen by Dr. Excell Seltzer, and at the time of his visit  it was noted that his blood pressures have been running consistently in  the 80-90 systolic range.  Because of this, they decreased his Lopressor  dose to 25 mg daily.  He completed a 5-day course of Lasix and is back  to his baseline weight with no further edema.  Overall, he has no  complaints today.   PHYSICAL EXAMINATION:  VITAL SIGNS:  Blood pressure is 102/63, pulse is  60 and regular, respirations 18, O2 sat 98% on room air.  CHEST:  His chest and right lower extremity incisions are all healed.  Sternum is stable to palpation.  HEART:  Regular rate and rhythm without murmurs, rubs, or gallops.  LUNGS:  Clear to auscultation.  EXTREMITIES:  No edema.   DIAGNOSTIC TESTS:  Chest x-ray shows resolution of previous pleural  effusions and otherwise is stable.   ASSESSMENT AND PLAN:  The patient has recovered nicely status post  coronary artery bypass graft x3.  At this point, we will discharge him  from our service.  He has been released to  begin driving and resume his normal activities.  He will continue to  follow up as directed with Dr. Excell Seltzer.  He will call our office if he  experiences any problems or has further questions.   Sheliah Plane, MD  Electronically Signed   GC/MEDQ  D:  07/12/2009  T:  07/13/2009  Job:  [id number]   cc:   Veverly Fells. Excell Seltzer, MD  Valetta Mole. Swords, MD

## 2011-01-10 ENCOUNTER — Encounter: Payer: Self-pay | Admitting: Gastroenterology

## 2011-01-23 ENCOUNTER — Ambulatory Visit (AMBULATORY_SURGERY_CENTER): Payer: BC Managed Care – PPO | Admitting: *Deleted

## 2011-01-23 VITALS — Ht 69.0 in | Wt 159.2 lb

## 2011-01-23 DIAGNOSIS — Z86010 Personal history of colonic polyps: Secondary | ICD-10-CM

## 2011-01-23 MED ORDER — PEG-KCL-NACL-NASULF-NA ASC-C 100 G PO SOLR: ORAL | Status: DC

## 2011-01-30 ENCOUNTER — Ambulatory Visit (AMBULATORY_SURGERY_CENTER): Payer: BC Managed Care – PPO | Admitting: Gastroenterology

## 2011-01-30 ENCOUNTER — Encounter: Payer: Self-pay | Admitting: Gastroenterology

## 2011-01-30 VITALS — BP 114/74 | HR 70 | Temp 98.8°F | Resp 16 | Ht 69.0 in | Wt 155.0 lb

## 2011-01-30 DIAGNOSIS — D126 Benign neoplasm of colon, unspecified: Secondary | ICD-10-CM

## 2011-01-30 DIAGNOSIS — Z1211 Encounter for screening for malignant neoplasm of colon: Secondary | ICD-10-CM

## 2011-01-30 DIAGNOSIS — Z139 Encounter for screening, unspecified: Secondary | ICD-10-CM

## 2011-01-30 MED ORDER — SODIUM CHLORIDE 0.9 % IV SOLN: 500.0000 mL | INTRAVENOUS | Status: DC

## 2011-01-30 NOTE — Patient Instructions (Signed)
Green and blue discharge instructions reviewed with patient and care partner.  Impressions: Polyp, handout given.  Repeat colonoscopy in 5-10 years pending pathology results.  Continue medications as you were taking them prior to the procedure.

## 2011-01-31 ENCOUNTER — Telehealth: Payer: Self-pay | Admitting: *Deleted

## 2011-01-31 NOTE — Telephone Encounter (Signed)

## 2011-02-06 ENCOUNTER — Other Ambulatory Visit: Payer: Self-pay | Admitting: Gastroenterology

## 2011-02-10 ENCOUNTER — Other Ambulatory Visit: Payer: Self-pay | Admitting: Gastroenterology

## 2011-05-23 ENCOUNTER — Other Ambulatory Visit (INDEPENDENT_AMBULATORY_CARE_PROVIDER_SITE_OTHER): Payer: BC Managed Care – PPO

## 2011-05-23 DIAGNOSIS — Z0000 Encounter for general adult medical examination without abnormal findings: Secondary | ICD-10-CM

## 2011-05-23 LAB — CBC WITH DIFFERENTIAL/PLATELET
Basophils Relative: 0.3 % (ref 0.0–3.0)
Eosinophils Absolute: 0.1 10*3/uL (ref 0.0–0.7)
Lymphocytes Relative: 23.6 % (ref 12.0–46.0)
MCHC: 33.4 g/dL (ref 30.0–36.0)
Neutrophils Relative %: 64.4 % (ref 43.0–77.0)
RBC: 4.67 Mil/uL (ref 4.22–5.81)
WBC: 4.5 10*3/uL (ref 4.5–10.5)

## 2011-05-23 LAB — POCT URINALYSIS DIPSTICK
Bilirubin, UA: NEGATIVE
Glucose, UA: NEGATIVE
Nitrite, UA: NEGATIVE
Spec Grav, UA: 1.02

## 2011-05-23 LAB — BASIC METABOLIC PANEL
CO2: 28 mEq/L (ref 19–32)
Calcium: 9.3 mg/dL (ref 8.4–10.5)
Creatinine, Ser: 0.8 mg/dL (ref 0.4–1.5)
GFR: 104.74 mL/min (ref >60.00)

## 2011-05-23 LAB — HEPATIC FUNCTION PANEL
ALT: 21 U/L (ref 0–53)
AST: 18 U/L (ref 0–37)
Albumin: 4.5 g/dL (ref 3.5–5.2)

## 2011-05-23 LAB — LIPID PANEL
HDL: 46.6 mg/dL (ref >39.00)
Total CHOL/HDL Ratio: 3
Triglycerides: 121 mg/dL (ref 0.0–149.0)

## 2011-05-23 LAB — TSH: TSH: 1.43 u[IU]/mL (ref 0.35–5.50)

## 2011-05-29 ENCOUNTER — Other Ambulatory Visit: Payer: Self-pay | Admitting: Internal Medicine

## 2011-05-30 ENCOUNTER — Encounter: Payer: BC Managed Care – PPO | Admitting: Internal Medicine

## 2011-06-01 ENCOUNTER — Telehealth: Payer: Self-pay | Admitting: Internal Medicine

## 2011-06-01 NOTE — Telephone Encounter (Signed)
Please refill generic Lipitor today [facility]. Thanks. Also he is requesting lab results.

## 2011-06-02 MED ORDER — ATORVASTATIN CALCIUM 10 MG PO TABS: 10.0000 mg | ORAL_TABLET | Freq: Every day | ORAL | Status: DC

## 2011-06-02 NOTE — Telephone Encounter (Signed)
Pts spouse call back and said that her spouse is still waiting to get lab results and also a refill of Lipitor. Pt is going out of town this Sunday and needs med called in today [facility].

## 2011-06-02 NOTE — Telephone Encounter (Signed)
rx sent in electronically, pt has appt in December for CPX

## 2011-07-18 ENCOUNTER — Encounter: Payer: Self-pay | Admitting: Internal Medicine

## 2011-07-18 ENCOUNTER — Ambulatory Visit (INDEPENDENT_AMBULATORY_CARE_PROVIDER_SITE_OTHER): Payer: BC Managed Care – PPO | Admitting: Internal Medicine

## 2011-07-18 VITALS — BP 112/82 | HR 64 | Temp 98.1°F | Ht 68.5 in | Wt 165.0 lb

## 2011-07-18 DIAGNOSIS — Z0000 Encounter for general adult medical examination without abnormal findings: Secondary | ICD-10-CM

## 2011-07-18 DIAGNOSIS — I2510 Atherosclerotic heart disease of native coronary artery without angina pectoris: Secondary | ICD-10-CM | POA: Insufficient documentation

## 2011-07-18 DIAGNOSIS — Z23 Encounter for immunization: Secondary | ICD-10-CM

## 2011-07-23 NOTE — Progress Notes (Signed)
Patient ID: Travis Cook, male   DOB: 07-01-1961, 50 y.o.   MRN: [id number] cpx Past Medical History  Diagnosis Date  . CAD (coronary artery disease) 2010    CAD    History   Social History  . Marital Status: Married    Spouse Name: N/A    Number of Children: N/A  . Years of Education: N/A   Occupational History  . Not on file.   Social History Main Topics  . Smoking status: Never Smoker   . Smokeless tobacco: Not on file  . Alcohol Use: 3.6 oz/week    6 Glasses of wine per week  . Drug Use: No  . Sexually Active: Not on file   Other Topics Concern  . Not on file   Social History Narrative  . No narrative on file    Past Surgical History  Procedure Date  . Heart bypass     3 vessels 2010  . Lithotripsy 2005     Family History  Problem Relation Age of Onset  . Heart disease Father 99    CAD    No Known Allergies  Current Outpatient Prescriptions on File Prior to Visit  Medication Sig Dispense Refill  . aspirin 325 MG tablet Take 325 mg by mouth daily.        [name] atorvastatin (LIPITOR) 10 MG tablet Take 1 tablet (10 mg total) by mouth daily.  90 tablet  3     patient denies chest pain, shortness of breath, orthopnea. Denies lower extremity edema, abdominal pain, change in appetite, change in bowel movements. Patient denies rashes, musculoskeletal complaints. No other specific complaints in a complete review of systems.   BP 112/82  Pulse 64  Temp(Src) 98.1 F (36.7 C) (Oral)  Ht 5' 8.5" (1.74 m)  Wt 165 lb (74.844 kg)  BMI 24.72 kg/m2  well-developed well-nourished male in no acute distress. HEENT exam atraumatic, normocephalic, neck supple without jugular venous distention. Chest clear to auscultation cardiac exam S1-S2 are regular. Abdominal exam overweight with bowel sounds, soft and nontender. Extremities no edema. Neurologic exam is alert with a normal gait.  Assessment and plan: Well visit. Health maintenance up-to-date. He has a fabulous job taking  care of himself since the diagnosis of coronary artery disease and since his bypass surgery.

## 2011-10-23 ENCOUNTER — Telehealth: Payer: Self-pay | Admitting: Internal Medicine

## 2011-10-23 NOTE — Telephone Encounter (Signed)
Pt requesting refill on ambien   [facility]

## 2011-10-24 ENCOUNTER — Telehealth: Payer: Self-pay | Admitting: *Deleted

## 2011-10-24 MED ORDER — ZOLPIDEM TARTRATE 10 MG PO TABS: 10.0000 mg | ORAL_TABLET | Freq: Every evening | ORAL | Status: DC | PRN

## 2011-10-24 NOTE — Telephone Encounter (Signed)
Notified pt. 

## 2011-10-24 NOTE — Telephone Encounter (Signed)
Zolpidem 10 mg. 1/2 - 1 po qhs prn insomnia. #15, 1 refill

## 2011-10-24 NOTE — Telephone Encounter (Signed)
Duplicate

## 2011-10-24 NOTE — Telephone Encounter (Signed)
Refill zolpidem 10 mg qhs.  Not on med list so I looked on med list in [facility] and the last time we called it in was 04/08/10 with 2 refills

## 2011-10-24 NOTE — Telephone Encounter (Signed)
Pt called to check on status of refill for Zolpidem 10 mg. Pt is getting ready to leave to go out of town and wants to make sure that this is called in today.

## 2012-01-04 ENCOUNTER — Encounter: Payer: Self-pay | Admitting: *Deleted

## 2012-01-09 ENCOUNTER — Encounter: Payer: Self-pay | Admitting: Cardiovascular Disease

## 2012-01-09 ENCOUNTER — Ambulatory Visit (INDEPENDENT_AMBULATORY_CARE_PROVIDER_SITE_OTHER): Payer: BC Managed Care – PPO | Admitting: Cardiovascular Disease

## 2012-01-09 VITALS — BP 122/76 | HR 76 | Resp 12 | Ht 69.0 in | Wt 164.4 lb

## 2012-01-09 DIAGNOSIS — I2510 Atherosclerotic heart disease of native coronary artery without angina pectoris: Secondary | ICD-10-CM

## 2012-01-09 DIAGNOSIS — E782 Mixed hyperlipidemia: Secondary | ICD-10-CM

## 2012-01-09 NOTE — Progress Notes (Signed)
Patient ID: Travis Cook, male   DOB: 12-20-1960, 51 y.o.   MRN: [id number] Travis Cook is seen today post CABG by Dr Tyrone Sage. He is doing well. His BP and HR always run low and I think we can stop his BB all together now. He has been taking an aspirin. His sternum has healed well. He is not having angina, palpitations, dyspnea or edema. F/U ETT 3/11 showed normal BP and HR response. Lipids followed by Dr Cato Mulligan have been good with normal LFTS' He has a lot of traveling to do this month.   CABG: 11/10 [name] [name] to [name], [name] OM and [name] to [name]  ROS: Denies fever, malais, weight loss, blurry vision, decreased visual acuity, cough, sputum, SOB, hemoptysis, pleuritic pain, palpitaitons, heartburn, abdominal pain, melena, lower extremity edema, claudication, or rash.  All other systems reviewed and negative  General: Affect appropriate Healthy:  appears stated age HEENT: normal Neck supple with no adenopathy JVP normal no bruits no thyromegaly Lungs clear with no wheezing and good diaphragmatic motion Heart:  S1/S2 no murmur, no rub, gallop or click PMI normal Abdomen: benighn, BS positve, no tenderness, no AAA no bruit.  No HSM or HJR Distal pulses intact with no bruits No edema Neuro non-focal Skin warm and dry No muscular weakness   Current Outpatient Prescriptions  Medication Sig Dispense Refill  . aspirin 325 MG tablet Take 325 mg by mouth daily.        [name] atorvastatin (LIPITOR) 10 MG tablet Take 1 tablet (10 mg total) by mouth daily.  90 tablet  3    Allergies  Review of patient's allergies indicates no known allergies.  Electrocardiogram:  Assessment and Plan

## 2012-01-09 NOTE — Assessment & Plan Note (Signed)
Cholesterol is at goal.  Continue current dose of statin and diet Rx.  No myalgias or side effects.  F/U  LFT's in 6 months. Lab Results  Component Value Date   LDLCALC 73 05/23/2011   Continue vegan like diet

## 2012-01-09 NOTE — Patient Instructions (Signed)
Your physician wants you to follow-up in: 1 year. You will receive a reminder letter in the mail two months in advance. If you don't receive a letter, please call our office to schedule the follow-up appointment.  

## 2012-01-09 NOTE — Assessment & Plan Note (Signed)
Stable with no angina and good activity level.  Continue medical Rx  

## 2012-01-16 NOTE — Progress Notes (Signed)
Addended by: Alawna Graybeal L on: 01/16/2012 06:38 PM   Modules accepted: Orders  

## 2012-01-24 ENCOUNTER — Telehealth: Payer: Self-pay | Admitting: Internal Medicine

## 2012-01-24 DIAGNOSIS — F329 Major depressive disorder, single episode, unspecified: Secondary | ICD-10-CM

## 2012-01-24 DIAGNOSIS — F32A Depression, unspecified: Secondary | ICD-10-CM

## 2012-01-24 NOTE — Telephone Encounter (Signed)
Ok per Dr. Swords, referral order placed 

## 2012-01-24 NOTE — Telephone Encounter (Signed)
Pt would like to be referred to a phycologist because he is having signs of depression and is interested on starting an anti-depressant. Please contact

## 2012-01-26 ENCOUNTER — Telehealth: Payer: Self-pay | Admitting: Internal Medicine

## 2012-01-26 MED ORDER — SERTRALINE HCL 50 MG PO TABS: 50.0000 mg | ORAL_TABLET | Freq: Every day | ORAL | Status: DC

## 2012-01-26 MED ORDER — LORAZEPAM 0.5 MG PO TABS: 0.2500 mg | ORAL_TABLET | Freq: Two times a day (BID) | ORAL | Status: AC | PRN

## 2012-01-26 NOTE — Telephone Encounter (Signed)
Talked with Dr. Ave Filter Received email from Sotirios Navarro is depressed and anxious Start sertraline and lorazepam

## 2012-01-29 NOTE — Telephone Encounter (Signed)
rx was called in on Friday 01/26/12

## 2012-01-29 NOTE — Telephone Encounter (Signed)
Call in lorazepam, if not already done

## 2012-01-29 NOTE — Telephone Encounter (Signed)
Call in lorazepam if not already done

## 2012-05-17 ENCOUNTER — Other Ambulatory Visit: Payer: Self-pay | Admitting: Internal Medicine

## 2012-06-18 ENCOUNTER — Other Ambulatory Visit: Payer: Self-pay | Admitting: Internal Medicine

## 2012-07-15 ENCOUNTER — Other Ambulatory Visit: Payer: Self-pay | Admitting: Internal Medicine

## 2012-10-01 ENCOUNTER — Ambulatory Visit (INDEPENDENT_AMBULATORY_CARE_PROVIDER_SITE_OTHER): Payer: BC Managed Care – PPO | Admitting: Family Medicine

## 2012-10-01 ENCOUNTER — Encounter: Payer: Self-pay | Admitting: Family Medicine

## 2012-10-01 VITALS — BP 98/70 | HR 70 | Temp 98.1°F | Wt 164.0 lb

## 2012-10-01 DIAGNOSIS — J069 Acute upper respiratory infection, unspecified: Secondary | ICD-10-CM

## 2012-10-01 MED ORDER — HYDROCODONE-HOMATROPINE 5-1.5 MG/5ML PO SYRP: 5.0000 mL | ORAL_SOLUTION | Freq: Three times a day (TID) | ORAL | Status: DC | PRN

## 2012-10-01 NOTE — Progress Notes (Signed)
Chief Complaint  Patient presents with  . Cough    body aches, and nasal congestion x 5 days     HPI:  Acute visit for cough: -started: 5 days -symptoms: nasal congestion, cough, drainage in throat -denies: fevers documented, SOB, NVD -has tried:Nyquil helps -sick contacts: none known, no flu exposure  ROS: See pertinent positives and negatives per HPI.  Past Medical History  Diagnosis Date  . CAD (coronary artery disease) 2010    CAD  . HYPERLIPIDEMIA 08/29/2007    Qualifier: Diagnosis of  By: Everett Graff    . NEPHROLITHIASIS, HX OF 08/29/2007    Qualifier: Diagnosis of  By: Tawanna Cooler RN, Alvino Chapel      Family History  Problem Relation Age of Onset  . Heart disease Father 4    CAD    History   Social History  . Marital Status: Married    Spouse Name: N/A    Number of Children: N/A  . Years of Education: N/A   Social History Main Topics  . Smoking status: Never Smoker   . Smokeless tobacco: None  . Alcohol Use: 3.6 oz/week    6 Glasses of wine per week  . Drug Use: No  . Sexually Active: None   Other Topics Concern  . None   Social History Narrative  . None    Current outpatient prescriptions:aspirin 325 MG tablet, Take 325 mg by mouth daily., Disp: , Rfl: ;  atorvastatin (LIPITOR) 10 MG tablet, Take 10 mg by mouth daily., Disp: , Rfl: ;  sertraline (ZOLOFT) 50 MG tablet, Take 1 tablet (50 mg total) by mouth daily., Disp: 30 tablet, Rfl: 3;  zolpidem (AMBIEN) 5 MG tablet, Take 5 mg by mouth at bedtime as needed., Disp: , Rfl:  atorvastatin (LIPITOR) 10 MG tablet, TAKE 1 TABLET BY MOUTH DAILY, Disp: 30 tablet, Rfl: 0;  atorvastatin (LIPITOR) 10 MG tablet, TAKE 1 TABLET BY MOUTH DAILY, Disp: 90 tablet, Rfl: 3;  HYDROcodone-homatropine (HYCODAN) 5-1.5 MG/5ML syrup, Take 5 mLs by mouth every 8 (eight) hours as needed for cough., Disp: 120 mL, Rfl: 0;  zolpidem (AMBIEN) 10 MG tablet, TAKE 1 TABLET BY MOUTH AT NIGHT AT BEDTIME AS NEEDED FOR SLEEP, Disp: 15 tablet, Rfl:  0  EXAM:  Filed Vitals:   10/01/12 0930  BP: 98/70  Pulse: 70  Temp: 98.1 F (36.7 C)    Body mass index is 24.21 kg/(m^2).  GENERAL: vitals reviewed and listed above, alert, oriented, appears well hydrated and in no acute distress  HEENT: atraumatic, conjunttiva clear, no obvious abnormalities on inspection of external nose and ears, normal appearance of ear canals and TMs, clear nasal congestion, mild post oropharyngeal erythema with PND, no tonsillar edema or exudate, no sinus TTP  NECK: no obvious masses on inspection  LUNGS: clear to auscultation bilaterally, no wheezes, rales or rhonchi, good air movement  CV: HRRR, no peripheral edema  MS: moves all extremities without noticeable abnormality  PSYCH: pleasant and cooperative, no obvious depression or anxiety  ASSESSMENT AND PLAN:  Discussed the following assessment and plan:  Acute upper respiratory infections of unspecified site - Plan: HYDROcodone-homatropine (HYCODAN) 5-1.5 MG/5ML syrup  -discussed this is likely a VURI, advised supportive care and return precautions, cough medication provided - discussed risks. -Patient advised to return or notify a doctor immediately if symptoms worsen or persist or new concerns arise.  Patient Instructions  INSTRUCTIONS FOR UPPER RESPIRATORY INFECTION:  -plenty of rest and fluids  -nasal saline wash 2-3  times daily (use prepackaged nasal saline or bottled/distilled water if making your own)   -can use sinex/afrin nasal spray for drainage and nasal congestion - but do NOT use longer then 3-4 days  -can use tylenol or ibuprofen as directed for aches and sorethroat  -in the winter time, using a humidifier at night is helpful (please follow cleaning instructions)  -if you are taking a cough medication - use only as directed, may also try a teaspoon of honey to coat the throat and throat lozenges  -for sore throat, salt water gargles can help  -follow up if you have  fevers, facial pain, tooth pain, difficulty breathing or are worsening or not getting better in 5-7 days      KIM, HANNAH R.

## 2012-10-01 NOTE — Patient Instructions (Addendum)
INSTRUCTIONS FOR UPPER RESPIRATORY INFECTION:  -plenty of rest and fluids  -nasal saline wash 2-3 times daily (use prepackaged nasal saline or bottled/distilled water if making your own)   -can use sinex/afrin nasal spray for drainage and nasal congestion - but do NOT use longer then 3-4 days  -can use tylenol or ibuprofen as directed for aches and sorethroat  -in the winter time, using a humidifier at night is helpful (please follow cleaning instructions)  -if you are taking a cough medication - use only as directed, may also try a teaspoon of honey to coat the throat and throat lozenges  -for sore throat, salt water gargles can help  -follow up if you have fevers, facial pain, tooth pain, difficulty breathing or are worsening or not getting better in 5-7 days  

## 2013-07-23 ENCOUNTER — Other Ambulatory Visit: Payer: Self-pay | Admitting: Internal Medicine

## 2013-07-23 ENCOUNTER — Telehealth: Payer: Self-pay | Admitting: Internal Medicine

## 2013-07-23 MED ORDER — ATORVASTATIN CALCIUM 10 MG PO TABS: ORAL_TABLET | ORAL | Status: DC

## 2013-07-23 NOTE — Telephone Encounter (Signed)
rx sent in electronically 

## 2013-07-23 NOTE — Telephone Encounter (Signed)
Pt has made cpe for Aug 27, 2013. Can you send in 30 days of atorvastatin (LIPITOR) [facility] ? Pt is working out of town.

## 2013-08-11 ENCOUNTER — Telehealth: Payer: Self-pay | Admitting: Internal Medicine

## 2013-08-11 NOTE — Telephone Encounter (Signed)
Pt cannot make his cpe on 1/14. Has not been seen in 2 yrs. -pt realizes he must be seen, but must travel for work,, rsc for 2/13. Will call for refill.

## 2013-08-15 ENCOUNTER — Other Ambulatory Visit (INDEPENDENT_AMBULATORY_CARE_PROVIDER_SITE_OTHER): Payer: BC Managed Care – PPO

## 2013-08-15 DIAGNOSIS — Z0000 Encounter for general adult medical examination without abnormal findings: Secondary | ICD-10-CM

## 2013-08-15 LAB — BASIC METABOLIC PANEL
BUN: 16 mg/dL (ref 6–23)
CALCIUM: 9.5 mg/dL (ref 8.4–10.5)
CO2: 29 mEq/L (ref 19–32)
Chloride: 105 mEq/L (ref 96–112)
Creatinine, Ser: 0.8 mg/dL (ref 0.4–1.5)
GFR: 105.33 mL/min (ref >60.00)
Glucose, Bld: 100 mg/dL — ABNORMAL HIGH (ref 70–99)
POTASSIUM: 5.5 meq/L — AB (ref 3.5–5.1)
SODIUM: 139 meq/L (ref 135–145)

## 2013-08-15 LAB — LIPID PANEL
CHOL/HDL RATIO: 3
Cholesterol: 168 mg/dL (ref 0–200)
HDL: 51.3 mg/dL (ref >39.00)
LDL CALC: 97 mg/dL (ref 0–99)
TRIGLYCERIDES: 101 mg/dL (ref 0.0–149.0)
VLDL: 20.2 mg/dL (ref 0.0–40.0)

## 2013-08-15 LAB — HEPATIC FUNCTION PANEL
ALT: 28 U/L (ref 0–53)
AST: 21 U/L (ref 0–37)
Albumin: 4.4 g/dL (ref 3.5–5.2)
Alkaline Phosphatase: 54 U/L (ref 39–117)
Bilirubin, Direct: 0.2 mg/dL (ref 0.0–0.3)
Total Bilirubin: 1.3 mg/dL — ABNORMAL HIGH (ref 0.3–1.2)
Total Protein: 6.6 g/dL (ref 6.0–8.3)

## 2013-08-15 LAB — CBC WITH DIFFERENTIAL/PLATELET
BASOS ABS: 0 10*3/uL (ref 0.0–0.1)
BASOS PCT: 0.3 % (ref 0.0–3.0)
EOS PCT: 2.2 % (ref 0.0–5.0)
Eosinophils Absolute: 0.1 10*3/uL (ref 0.0–0.7)
HEMATOCRIT: 45.4 % (ref 39.0–52.0)
HEMOGLOBIN: 15.2 g/dL (ref 13.0–17.0)
LYMPHS PCT: 25.1 % (ref 12.0–46.0)
Lymphs Abs: 1.4 10*3/uL (ref 0.7–4.0)
MCHC: 33.5 g/dL (ref 30.0–36.0)
MCV: 90 fl (ref 78.0–100.0)
MONOS PCT: 12.3 % — AB (ref 3.0–12.0)
Monocytes Absolute: 0.7 10*3/uL (ref 0.1–1.0)
NEUTROS ABS: 3.4 10*3/uL (ref 1.4–7.7)
Neutrophils Relative %: 60.1 % (ref 43.0–77.0)
Platelets: 172 10*3/uL (ref 150.0–400.0)
RBC: 5.04 Mil/uL (ref 4.22–5.81)
RDW: 13.3 % (ref 11.5–14.6)
WBC: 5.6 10*3/uL (ref 4.5–10.5)

## 2013-08-15 LAB — POCT URINALYSIS DIPSTICK
Bilirubin, UA: NEGATIVE
GLUCOSE UA: NEGATIVE
Ketones, UA: NEGATIVE
Leukocytes, UA: NEGATIVE
Nitrite, UA: NEGATIVE
PROTEIN UA: NEGATIVE
RBC UA: NEGATIVE
SPEC GRAV UA: 1.015
Urobilinogen, UA: 0.2
pH, UA: 7

## 2013-08-15 LAB — PSA: PSA: 0.58 ng/mL (ref 0.10–4.00)

## 2013-08-15 LAB — TSH: TSH: 1.14 u[IU]/mL (ref 0.35–5.50)

## 2013-08-19 ENCOUNTER — Other Ambulatory Visit: Payer: Self-pay | Admitting: *Deleted

## 2013-08-19 MED ORDER — ATORVASTATIN CALCIUM 10 MG PO TABS: ORAL_TABLET | ORAL | Status: DC

## 2013-08-22 ENCOUNTER — Other Ambulatory Visit (INDEPENDENT_AMBULATORY_CARE_PROVIDER_SITE_OTHER): Payer: BC Managed Care – PPO

## 2013-08-22 DIAGNOSIS — I10 Essential (primary) hypertension: Secondary | ICD-10-CM

## 2013-08-22 LAB — BASIC METABOLIC PANEL
BUN: 13 mg/dL (ref 6–23)
CO2: 27 mEq/L (ref 19–32)
CREATININE: 0.9 mg/dL (ref 0.4–1.5)
Calcium: 9.3 mg/dL (ref 8.4–10.5)
Chloride: 107 mEq/L (ref 96–112)
GFR: 99.63 mL/min (ref >60.00)
Glucose, Bld: 79 mg/dL (ref 70–99)
Potassium: 4.7 mEq/L (ref 3.5–5.1)
Sodium: 140 mEq/L (ref 135–145)

## 2013-08-27 ENCOUNTER — Encounter: Payer: BC Managed Care – PPO | Admitting: Internal Medicine

## 2013-09-03 ENCOUNTER — Telehealth: Payer: Self-pay | Admitting: Internal Medicine

## 2013-09-03 NOTE — Telephone Encounter (Signed)
pls call w/ lab results for pt's potassium. Wife called

## 2013-09-04 NOTE — Telephone Encounter (Signed)
Pt wife is aware nurse is gone for today

## 2013-09-05 NOTE — Telephone Encounter (Signed)
Pt's wife aware that high potassium reading was a lab error

## 2013-09-24 ENCOUNTER — Other Ambulatory Visit: Payer: Self-pay | Admitting: Internal Medicine

## 2013-09-26 ENCOUNTER — Encounter: Payer: BC Managed Care – PPO | Admitting: Internal Medicine

## 2013-09-30 ENCOUNTER — Other Ambulatory Visit: Payer: Self-pay | Admitting: Internal Medicine

## 2013-10-24 ENCOUNTER — Encounter: Payer: BC Managed Care – PPO | Admitting: Internal Medicine

## 2013-10-30 ENCOUNTER — Other Ambulatory Visit: Payer: Self-pay | Admitting: Internal Medicine

## 2013-12-02 ENCOUNTER — Other Ambulatory Visit: Payer: Self-pay | Admitting: Internal Medicine

## 2013-12-09 ENCOUNTER — Ambulatory Visit (INDEPENDENT_AMBULATORY_CARE_PROVIDER_SITE_OTHER): Payer: Federal, State, Local not specified - PPO | Admitting: Internal Medicine

## 2013-12-09 ENCOUNTER — Encounter: Payer: Self-pay | Admitting: Internal Medicine

## 2013-12-09 VITALS — BP 116/84 | HR 68 | Temp 98.5°F | Ht 68.5 in | Wt 171.0 lb

## 2013-12-09 DIAGNOSIS — Z0000 Encounter for general adult medical examination without abnormal findings: Secondary | ICD-10-CM

## 2013-12-09 NOTE — Progress Notes (Signed)
CPX  Past Medical History  Diagnosis Date  . CAD (coronary artery disease) 2010    CAD  . HYPERLIPIDEMIA 08/29/2007    Qualifier: Diagnosis of  By: Scherrie Gerlach    . NEPHROLITHIASIS, HX OF 08/29/2007    Qualifier: Diagnosis of  By: Sherren Mocha RN, Dorian Pod      History   Social History  . Marital Status: Married    Spouse Name: N/A    Number of Children: N/A  . Years of Education: N/A   Occupational History  . Not on file.   Social History Main Topics  . Smoking status: Never Smoker   . Smokeless tobacco: Not on file  . Alcohol Use: 3.6 oz/week    6 Glasses of wine per week  . Drug Use: No  . Sexual Activity: Not on file   Other Topics Concern  . Not on file   Social History Narrative  . No narrative on file    Past Surgical History  Procedure Laterality Date  . Heart bypass      3 vessels 2010  . Lithotripsy 2005      Family History  Problem Relation Age of Onset  . Heart disease Father 50    CAD    No Known Allergies  Current Outpatient Prescriptions on File Prior to Visit  Medication Sig Dispense Refill  . aspirin 325 MG tablet Take 325 mg by mouth daily.      [name] atorvastatin (LIPITOR) 10 MG tablet TAKE 1 TABLET BY MOUTH DAILY  30 tablet  0  . zolpidem (AMBIEN) 10 MG tablet TAKE 1 TABLET AT BEDTIME AS NEEDED FOR SLEEP  15 tablet  0   No current facility-administered medications on file prior to visit.     patient denies chest pain, shortness of breath, orthopnea. Denies lower extremity edema, abdominal pain, change in appetite, change in bowel movements. Patient denies rashes, musculoskeletal complaints. No other specific complaints in a complete review of systems.   BP 116/84  Pulse 68  Temp(Src) 98.5 F (36.9 C) (Oral)  Ht 5' 8.5" (1.74 m)  Wt 171 lb (77.565 kg)  BMI 25.62 kg/m2 .Well-developed male in no acute distress. HEENT exam atraumatic, normocephalic, extraocular muscles are intact. Conjunctivae are pink without exudate. Neck is supple without  lymphadenopathy, thyromegaly, jugular venous distention. Chest is clear to auscultation without increased work of breathing. Cardiac exam S1-S2 are regular. The PMI is normal. No significant murmurs or gallops. Abdominal exam active bowel sounds, soft, nontender. No abdominal bruits. Extremities no clubbing cyanosis or edema. Peripheral pulses are normal without bruits. Neurologic exam alert and oriented without any motor or sensory deficits. Rectal exam normal tone prostate normal size without masses or asymmetry. .  Well Visit- health maint UTD Recommended continued good health habits

## 2013-12-09 NOTE — Progress Notes (Signed)
Pre visit review using our clinic review tool, if applicable. No additional management support is needed unless otherwise documented below in the visit note. 

## 2013-12-10 ENCOUNTER — Other Ambulatory Visit: Payer: Self-pay | Admitting: Internal Medicine

## 2014-01-06 ENCOUNTER — Other Ambulatory Visit: Payer: Self-pay | Admitting: Internal Medicine

## 2014-01-06 ENCOUNTER — Ambulatory Visit (INDEPENDENT_AMBULATORY_CARE_PROVIDER_SITE_OTHER): Payer: Federal, State, Local not specified - PPO | Admitting: General Surgery

## 2014-01-06 ENCOUNTER — Encounter (INDEPENDENT_AMBULATORY_CARE_PROVIDER_SITE_OTHER): Payer: Self-pay | Admitting: General Surgery

## 2014-01-06 VITALS — BP 110/70 | HR 68 | Temp 97.0°F | Resp 14 | Ht 68.5 in | Wt 171.0 lb

## 2014-01-06 DIAGNOSIS — K4090 Unilateral inguinal hernia, without obstruction or gangrene, not specified as recurrent: Secondary | ICD-10-CM | POA: Insufficient documentation

## 2014-01-06 NOTE — Patient Instructions (Signed)
Stop Aspirin 5 days before your surgery.   [facility] _______[facility]  UMBILICAL OR INGUINAL HERNIA REPAIR: POST OP INSTRUCTIONS  Always review your discharge instruction sheet given to you by the facility where your surgery was performed. IF YOU HAVE DISABILITY OR FAMILY LEAVE FORMS, YOU MUST BRING THEM TO THE OFFICE FOR PROCESSING.   DO NOT GIVE THEM TO YOUR DOCTOR.  1. A  prescription for pain medication may be given to you upon discharge.  Take your pain medication as prescribed, if needed.  If narcotic pain medicine is not needed, then you may take acetaminophen (Tylenol) or ibuprofen (Advil) as needed. 2. Take your usually prescribed medications unless otherwise directed. 3. If you need a refill on your pain medication, please contact your pharmacy.  They will contact our office to request authorization. Prescriptions will not be filled after 5 pm or on week-ends. 4. You should follow a light diet the first 24 hours after arrival home, such as soup and crackers, etc.  Be sure to include lots of fluids daily.  Resume your normal diet the day after surgery. 5. Most patients will experience some swelling and bruising around the umbilicus or in the groin and scrotum.  Ice packs and reclining will help.  Swelling and bruising can take several days to resolve.  6. It is common to experience some constipation if taking pain medication after surgery.  Increasing fluid intake and taking a stool softener (such as Colace) will usually help or prevent this problem from occurring.  A mild laxative (Milk of Magnesia or Miralax) should be taken according to package directions if there are no bowel movements after 48 hours. 7. Unless discharge instructions indicate otherwise, you may remove your bandages 24-48 hours after surgery, and you may shower at that time.  You may have steri-strips (small skin tapes) in place directly over the incision.  These strips should be left on the skin for 7-10  days.  If your surgeon used skin glue on the incision, you may shower in 24 hours.  The glue will flake off over the next 2-3 weeks.  Any sutures or staples will be removed at the office during your follow-up visit. 8. ACTIVITIES:  You may resume regular (light) daily activities beginning the next day-such as daily self-care, walking, climbing stairs-gradually increasing activities as tolerated.  You may have sexual intercourse when it is comfortable.  Refrain from any heavy lifting or straining until approved by your doctor. a. You may drive when you are no longer taking prescription pain medication, you can comfortably wear a seatbelt, and you can safely maneuver your car and apply brakes. b. RETURN TO WORK:  1-2 weeks light work.  Full duty in 6 weeks.__________________________________________________________ 9. You should see your doctor in the office for a follow-up appointment approximately 2-3 weeks after your surgery.  Make sure that you call for this appointment within a day or two after you arrive home to insure a convenient appointment time. 10. OTHER INSTRUCTIONS:  __________________________________________________________________________________________________________________________________________________________________________________________  WHEN TO CALL YOUR DOCTOR: 1. Fever over 101.0 2. Inability to urinate 3. Nausea and/or vomiting 4. Extreme swelling or bruising 5. Continued bleeding from incision. 6. Increased pain, redness, or drainage from the incision  The clinic staff is available to answer your questions during regular business hours.  Please don't hesitate to call and ask to speak to one of the nurses for clinical concerns.  If you have a medical emergency, go to the nearest emergency room or  call 911.  A surgeon from [facility] is always on call at the hospital   [address], [place], [place], [place]  [address] ?  [address], [place],  [place]   [address] [phone] ? [phone] ? FAX [phone] Web site: [email or web address]

## 2014-01-06 NOTE — Progress Notes (Signed)
Patient ID: Travis Cook, male   DOB: 07/08/1961, 52 y.o.   MRN: [id number]  Chief Complaint  Patient presents with  . New Evaluation    eval LIH    HPI Travis Cook is a 52 y.o. male.   HPI  He is self-referred. He has some significant sneezing then noticed a bulge in the left groin area. It is getting slightly larger. Sometimes he has to manually reduce it. It causes mild discomfort. He has alters his activities because of the bulge. It does not interfere with him going to the bathroom. He does not have to strain to urinate. He denies constipation. He is very active normally. He states he thinks he may have had a hernia repair as a baby.  Past Medical History  Diagnosis Date  . CAD (coronary artery disease) 2010    CAD  . HYPERLIPIDEMIA 08/29/2007    Qualifier: Diagnosis of  By: Wilbur Oakland, RN, Ellen    . NEPHROLITHIASIS, HX OF 08/29/2007    Qualifier: Diagnosis of  By: Kaylen Nghiem, RN, Ellen      Past Surgical History  Procedure Laterality Date  . Heart bypass      3 vessels 2010  . Lithotripsy 2005      Family History  Problem Relation Age of Onset  . Heart disease Father 49    CAD    Social History History  Substance Use Topics  . Smoking status: Never Smoker   . Smokeless tobacco: Never Used  . Alcohol Use: 3.0 - 3.6 oz/week    5-6 Glasses of wine per week    No Known Allergies  Current Outpatient Prescriptions  Medication Sig Dispense Refill  . aspirin 325 MG tablet Take 325 mg by mouth daily.      . atorvastatin (LIPITOR) 10 MG tablet TAKE 1 TABLET BY MOUTH DAILY  30 tablet  0  . zolpidem (AMBIEN) 10 MG tablet TAKE 1 TABLET AT BEDTIME AS NEEDED FOR SLEEP  15 tablet  0   No current facility-administered medications for this visit.    Review of Systems Review of Systems  HENT: Negative.   Respiratory: Negative.   Cardiovascular: Negative.   Gastrointestinal: Negative.   Genitourinary: Negative.     Blood pressure 110/70, pulse 68, temperature 97 F (36.1 C),  temperature source Temporal, resp. rate 14, height 5' 8.5" (1.74 m), weight 171 lb (77.565 kg).  Physical Exam Physical Exam  Constitutional: He appears well-developed and well-nourished. No distress.  HENT:  Head: Normocephalic and atraumatic.  Cardiovascular: Normal rate and regular rhythm.   Pulmonary/Chest: Effort normal and breath sounds normal.  Abdominal: Soft.  No umbilical bulge.  Genitourinary:  Reducible left inguinal bulge. No right inguinal bulge.  Neurological: He is alert.  Skin: Skin is warm and dry.  Psychiatric: He has a normal mood and affect. His behavior is normal.    Data Reviewed Note from Dr. Sword's  Assessment    Symptomatic left inguinal hernia. He is interested in repair. We discussed open and laparoscopic repairs.     Plan    Open left inguinal hernia repair with mesh and OnQ pump placement. Will communicate with his cardiologist preoperatively to make sure there are no contraindications to the surgery.  I have explained the procedure, risks, and aftercare of inguinal hernia repair.  Risks include but are not limited to bleeding, infection, wound problems, anesthesia, recurrence, bladder or intestine injury, urinary retention, testicular dysfunction, chronic pain, mesh problems.  He/she seems to understand and   agrees with the plan.       Yarelli Decelles J Tekoa Hamor 01/06/2014, 2:51 PM    

## 2014-01-07 NOTE — Telephone Encounter (Signed)
Pt needs new rx for  atorvastatin (LIPITOR) 10 MG tablet there is no more refills

## 2014-01-08 ENCOUNTER — Encounter (INDEPENDENT_AMBULATORY_CARE_PROVIDER_SITE_OTHER): Payer: Self-pay | Admitting: General Surgery

## 2014-01-08 NOTE — Progress Notes (Signed)
Patient ID: Travis Cook, male   DOB: 1960-12-18, 53 y.o.   MRN: [id number] Received a note from Dr. [name] who felt [name] would be at low risk from a cardiovascular standpoint for inguinal hernia repair.

## 2014-01-12 NOTE — Pre-Procedure Instructions (Signed)
Travis Cook  01/12/2014   Your procedure is scheduled on:  01/16/14  Report to [facility] Admitting at 530 AM.  Call this number if you have problems the morning of surgery: [phone]   Remember:   Do not eat food or drink liquids after midnight.   Take these medicines the morning of surgery with A SIP OF WATER: none   Do not wear jewelry, make-up or nail polish.  Do not wear lotions, powders, or perfumes. You may wear deodorant.  Do not shave 48 hours prior to surgery. Men may shave face and neck.  Do not bring valuables to the hospital.  [facility] is not responsible                  for any belongings or valuables.               Contacts, dentures or bridgework may not be worn into surgery.  Leave suitcase in the car. After surgery it may be brought to your room.  For patients admitted to the hospital, discharge time is determined by your                treatment team.               Patients discharged the day of surgery will not be allowed to drive  home.  Name and phone number of your driver: family  Special Instructions: Shower using CHG 2 nights before surgery and the night before surgery.  If you shower the day of surgery use CHG.  Use special wash - you have one bottle of CHG for all showers.  You should use approximately 1/3 of the bottle for each shower.   Please read over the following fact sheets that you were given: Pain Booklet, Coughing and Deep Breathing and Surgical Site Infection Prevention

## 2014-01-13 ENCOUNTER — Encounter (HOSPITAL_COMMUNITY): Payer: Self-pay

## 2014-01-13 ENCOUNTER — Encounter (HOSPITAL_COMMUNITY)
Admission: RE | Admit: 2014-01-13 | Discharge: 2014-01-13 | Disposition: A | Discharge status: Home / Self Care | Payer: Federal, State, Local not specified - PPO | Source: Ambulatory Visit | Attending: General Surgery | Admitting: General Surgery

## 2014-01-13 HISTORY — DX: Anxiety disorder, unspecified: F41.9

## 2014-01-13 LAB — CBC
HCT: 41.9 % (ref 39.0–52.0)
Hemoglobin: 14.5 g/dL (ref 13.0–17.0)
MCH: 30.9 pg (ref 26.0–34.0)
MCHC: 34.6 g/dL (ref 30.0–36.0)
MCV: 89.3 fL (ref 78.0–100.0)
PLATELETS: 176 10*3/uL (ref 150–400)
RBC: 4.69 MIL/uL (ref 4.22–5.81)
RDW: 13.3 % (ref 11.5–15.5)
WBC: 5 10*3/uL (ref 4.0–10.5)

## 2014-01-13 LAB — BASIC METABOLIC PANEL
BUN: 10 mg/dL (ref 6–23)
CHLORIDE: 104 meq/L (ref 96–112)
CO2: 24 meq/L (ref 19–32)
Calcium: 9.7 mg/dL (ref 8.4–10.5)
Creatinine, Ser: 0.8 mg/dL (ref 0.50–1.35)
GFR calc non Af Amer: >90 mL/min (ref >90)
Glucose, Bld: 100 mg/dL — ABNORMAL HIGH (ref 70–99)
Potassium: 4.6 mEq/L (ref 3.7–5.3)
Sodium: 140 mEq/L (ref 137–147)

## 2014-01-14 NOTE — Progress Notes (Signed)
Anesthesia chart review: Patient is a 53 year old male scheduled for left inguinal hernia repair with mesh and On-Q pump placement on 01/16/14 by Dr. [name].  History includes CAD s/p CABG ([name] to [name], [name] to [name], [name] to distal CX) 06/18/09, hyperlipidemia, nephrolithiasis, anxiety, nonsmoker. Cardiologist is Dr. [name], last visit in [place] is from 01/09/12; however, Dr. [name] received a note from Dr. [name] stating that Mr. Narciso would be at low risk from a CV standpoint for this surgery. (See progress note from 01/08/14.) PCP is Dr. Phoebe Sharps.  EKG on 01/13/14 showed NSR, possible LAE, non-specific T wave abnormality.  Echo on 07/02/09 showed: - Left ventricle: The cavity size was normal. Wall thickness was normal. Systolic function was normal. The estimated ejection fraction was in the range of 55% to 60%. Wall motion was normal; there were no regional wall motion abnormalities. - Mitral valve: Mild regurgitation. - Left atrium: The atrium was mildly dilated. - Pulmonic valve: Mild regurgitation. - Tricuspid valve: Mild regurgitation. - Pulmonary arteries: Systolic pressure was mildly increased. PA peak pressure: 52mm Hg (S).  His last stress test and cath were prior to his CABG.  Preoperative CXR and labs noted.  If no acute changes then I would anticipate that he could proceed as planned.  George Hugh [facility] Short Stay Center/Anesthesiology Phone [phone] 01/14/2014 10:40 AM

## 2014-01-16 ENCOUNTER — Ambulatory Visit (HOSPITAL_COMMUNITY)
Admission: RE | Admit: 2014-01-16 | Discharge: 2014-01-16 | Disposition: A | Discharge status: Home / Self Care | Payer: Federal, State, Local not specified - PPO | Source: Ambulatory Visit | Attending: General Surgery | Admitting: General Surgery

## 2014-01-16 ENCOUNTER — Encounter: Payer: BC Managed Care – PPO | Admitting: Internal Medicine

## 2014-01-16 ENCOUNTER — Encounter (HOSPITAL_COMMUNITY): Payer: Federal, State, Local not specified - PPO | Admitting: Vascular Surgery

## 2014-01-16 ENCOUNTER — Encounter (HOSPITAL_COMMUNITY): Payer: Self-pay | Admitting: *Deleted

## 2014-01-16 ENCOUNTER — Ambulatory Visit (HOSPITAL_COMMUNITY): Payer: Federal, State, Local not specified - PPO | Admitting: Anesthesiology

## 2014-01-16 ENCOUNTER — Encounter (HOSPITAL_COMMUNITY)
Admission: RE | Disposition: A | Discharge status: Home / Self Care | Payer: Self-pay | Source: Ambulatory Visit | Attending: General Surgery

## 2014-01-16 DIAGNOSIS — K589 Irritable bowel syndrome without diarrhea: Secondary | ICD-10-CM | POA: Insufficient documentation

## 2014-01-16 DIAGNOSIS — E785 Hyperlipidemia, unspecified: Secondary | ICD-10-CM | POA: Insufficient documentation

## 2014-01-16 DIAGNOSIS — K4090 Unilateral inguinal hernia, without obstruction or gangrene, not specified as recurrent: Secondary | ICD-10-CM | POA: Insufficient documentation

## 2014-01-16 DIAGNOSIS — I2510 Atherosclerotic heart disease of native coronary artery without angina pectoris: Secondary | ICD-10-CM | POA: Insufficient documentation

## 2014-01-16 DIAGNOSIS — Z7982 Long term (current) use of aspirin: Secondary | ICD-10-CM | POA: Insufficient documentation

## 2014-01-16 DIAGNOSIS — Z79899 Other long term (current) drug therapy: Secondary | ICD-10-CM | POA: Insufficient documentation

## 2014-01-16 DIAGNOSIS — Z951 Presence of aortocoronary bypass graft: Secondary | ICD-10-CM | POA: Insufficient documentation

## 2014-01-16 HISTORY — PX: INGUINAL HERNIA REPAIR: SHX194

## 2014-01-16 HISTORY — PX: INSERTION OF MESH: SHX5868

## 2014-01-16 SURGERY — REPAIR, HERNIA, INGUINAL, ADULT
Anesthesia: General | Site: Groin | Laterality: Left

## 2014-01-16 MED ORDER — EPHEDRINE SULFATE 50 MG/ML IJ SOLN
INTRAMUSCULAR | Status: AC
  Filled 2014-01-16: qty 1

## 2014-01-16 MED ORDER — SUCCINYLCHOLINE CHLORIDE 20 MG/ML IJ SOLN
INTRAMUSCULAR | Status: AC
  Filled 2014-01-16: qty 1

## 2014-01-16 MED ORDER — LIDOCAINE HCL (CARDIAC) 20 MG/ML IV SOLN
INTRAVENOUS | Status: AC
  Filled 2014-01-16: qty 5

## 2014-01-16 MED ORDER — SODIUM CHLORIDE 0.9 % IJ SOLN
INTRAMUSCULAR | Status: AC
  Filled 2014-01-16: qty 10

## 2014-01-16 MED ORDER — ACETAMINOPHEN 650 MG RE SUPP: 650.0000 mg | RECTAL | Status: DC | PRN

## 2014-01-16 MED ORDER — HYDROMORPHONE HCL PF 1 MG/ML IJ SOLN: 0.2500 mg | INTRAMUSCULAR | Status: DC | PRN

## 2014-01-16 MED ORDER — LACTATED RINGERS IV SOLN
INTRAVENOUS | Status: DC | PRN
  Administered 2014-01-16 (×2): via INTRAVENOUS

## 2014-01-16 MED ORDER — MIDAZOLAM HCL 2 MG/2ML IJ SOLN
INTRAMUSCULAR | Status: AC
  Filled 2014-01-16: qty 2

## 2014-01-16 MED ORDER — CEFAZOLIN SODIUM-DEXTROSE 2-3 GM-% IV SOLR
2.0000 g | INTRAVENOUS | Status: AC
  Administered 2014-01-16: 2 g via INTRAVENOUS
  Filled 2014-01-16: qty 50

## 2014-01-16 MED ORDER — OXYCODONE HCL 5 MG/5ML PO SOLN: 5.0000 mg | Freq: Once | ORAL | Status: DC | PRN

## 2014-01-16 MED ORDER — GLYCOPYRROLATE 0.2 MG/ML IJ SOLN
INTRAMUSCULAR | Status: AC
  Filled 2014-01-16: qty 1

## 2014-01-16 MED ORDER — BUPIVACAINE-EPINEPHRINE (PF) 0.5% -1:200000 IJ SOLN
INTRAMUSCULAR | Status: AC
  Filled 2014-01-16: qty 30

## 2014-01-16 MED ORDER — DEXAMETHASONE SODIUM PHOSPHATE 4 MG/ML IJ SOLN
INTRAMUSCULAR | Status: AC
  Filled 2014-01-16: qty 1

## 2014-01-16 MED ORDER — PROPOFOL 10 MG/ML IV BOLUS
INTRAVENOUS | Status: AC
  Filled 2014-01-16: qty 20

## 2014-01-16 MED ORDER — 0.9 % SODIUM CHLORIDE (POUR BTL) OPTIME
TOPICAL | Status: DC | PRN
  Administered 2014-01-16: 1000 mL

## 2014-01-16 MED ORDER — PROPOFOL 10 MG/ML IV BOLUS
INTRAVENOUS | Status: DC | PRN
  Administered 2014-01-16: 160 mg via INTRAVENOUS

## 2014-01-16 MED ORDER — LIDOCAINE HCL (CARDIAC) 20 MG/ML IV SOLN
INTRAVENOUS | Status: DC | PRN
  Administered 2014-01-16: 40 mg via INTRAVENOUS

## 2014-01-16 MED ORDER — MIDAZOLAM HCL 5 MG/5ML IJ SOLN
INTRAMUSCULAR | Status: DC | PRN
  Administered 2014-01-16: 2 mg via INTRAVENOUS

## 2014-01-16 MED ORDER — ROCURONIUM BROMIDE 50 MG/5ML IV SOLN
INTRAVENOUS | Status: AC
  Filled 2014-01-16: qty 1

## 2014-01-16 MED ORDER — FENTANYL CITRATE 0.05 MG/ML IJ SOLN
INTRAMUSCULAR | Status: AC
  Filled 2014-01-16: qty 5

## 2014-01-16 MED ORDER — ONDANSETRON HCL 4 MG/2ML IJ SOLN
INTRAMUSCULAR | Status: DC | PRN
  Administered 2014-01-16: 4 mg via INTRAVENOUS

## 2014-01-16 MED ORDER — OXYCODONE HCL 5 MG PO TABS
5.0000 mg | ORAL_TABLET | ORAL | Status: DC | PRN
Start: 2014-01-16 — End: 2014-01-20

## 2014-01-16 MED ORDER — ONDANSETRON HCL 4 MG/2ML IJ SOLN
INTRAMUSCULAR | Status: AC
  Filled 2014-01-16: qty 2

## 2014-01-16 MED ORDER — FENTANYL CITRATE 0.05 MG/ML IJ SOLN
INTRAMUSCULAR | Status: DC | PRN
  Administered 2014-01-16 (×4): 50 ug via INTRAVENOUS

## 2014-01-16 MED ORDER — BUPIVACAINE-EPINEPHRINE 0.5% -1:200000 IJ SOLN
INTRAMUSCULAR | Status: DC | PRN
  Administered 2014-01-16: 10 mL

## 2014-01-16 MED ORDER — ACETAMINOPHEN 325 MG PO TABS: 650.0000 mg | ORAL_TABLET | ORAL | Status: DC | PRN

## 2014-01-16 MED ORDER — OXYCODONE HCL 5 MG PO TABS: 5.0000 mg | ORAL_TABLET | Freq: Once | ORAL | Status: DC | PRN

## 2014-01-16 MED ORDER — SODIUM CHLORIDE 0.9 % IJ SOLN: 3.0000 mL | INTRAMUSCULAR | Status: DC | PRN

## 2014-01-16 MED ORDER — BUPIVACAINE 0.25 % ON-Q PUMP SINGLE CATH 100 ML
INJECTION | Status: DC | PRN
  Administered 2014-01-16: 100 mL

## 2014-01-16 MED ORDER — BUPIVACAINE 0.25 % ON-Q PUMP SINGLE CATH 100 ML
100.0000 mL | INJECTION | Status: DC
  Filled 2014-01-16: qty 100

## 2014-01-16 MED ORDER — MORPHINE SULFATE 2 MG/ML IJ SOLN: 2.0000 mg | INTRAMUSCULAR | Status: DC | PRN

## 2014-01-16 MED ORDER — PHENYLEPHRINE 40 MCG/ML (10ML) SYRINGE FOR IV PUSH (FOR BLOOD PRESSURE SUPPORT)
PREFILLED_SYRINGE | INTRAVENOUS | Status: AC
  Filled 2014-01-16: qty 10

## 2014-01-16 MED ORDER — ONDANSETRON HCL 4 MG/2ML IJ SOLN: 4.0000 mg | Freq: Once | INTRAMUSCULAR | Status: DC | PRN

## 2014-01-16 MED ORDER — OXYCODONE HCL 5 MG PO TABS: 5.0000 mg | ORAL_TABLET | ORAL | Status: DC | PRN

## 2014-01-16 MED ORDER — EPHEDRINE SULFATE 50 MG/ML IJ SOLN
INTRAMUSCULAR | Status: DC | PRN
  Administered 2014-01-16 (×4): 5 mg via INTRAVENOUS

## 2014-01-16 MED ORDER — DEXAMETHASONE SODIUM PHOSPHATE 4 MG/ML IJ SOLN
INTRAMUSCULAR | Status: DC | PRN
Start: 2014-01-16 — End: 2014-01-16
  Administered 2014-01-16: 4 mg via INTRAVENOUS

## 2014-01-16 SURGICAL SUPPLY — 51 items
APL SKNCLS STERI-STRIP NONHPOA (GAUZE/BANDAGES/DRESSINGS) ×1
BENZOIN TINCTURE PRP APPL 2/3 (GAUZE/BANDAGES/DRESSINGS) ×2 IMPLANT
BLADE SURG 10 STRL SS (BLADE) ×2 IMPLANT
BLADE SURG 15 STRL LF DISP TIS (BLADE) ×1 IMPLANT
BLADE SURG 15 STRL SS (BLADE) ×2
BLADE SURG ROTATE 9660 (MISCELLANEOUS) ×2 IMPLANT
CATH KIT ON Q 5IN SLV (PAIN MANAGEMENT) ×2 IMPLANT
CHLORAPREP W/TINT 26ML (MISCELLANEOUS) ×2 IMPLANT
CLSR STERI-STRIP ANTIMIC 1/2X4 (GAUZE/BANDAGES/DRESSINGS) ×1 IMPLANT
COVER SURGICAL LIGHT HANDLE (MISCELLANEOUS) ×3 IMPLANT
DRAIN PENROSE 1/2X12 LTX STRL (WOUND CARE) ×1 IMPLANT
DRAPE INCISE IOBAN 66X45 STRL (DRAPES) ×2 IMPLANT
DRAPE LAPAROTOMY TRNSV 102X78 (DRAPE) ×2 IMPLANT
DRAPE UTILITY 15X26 W/TAPE STR (DRAPE) ×4 IMPLANT
DRESSING TELFA 8X3 (GAUZE/BANDAGES/DRESSINGS) ×2 IMPLANT
DRSG OPSITE 6X11 MED (GAUZE/BANDAGES/DRESSINGS) ×2 IMPLANT
DRSG TEGADERM 2-3/8X2-3/4 SM (GAUZE/BANDAGES/DRESSINGS) ×1 IMPLANT
DRSG TELFA 3X8 NADH (GAUZE/BANDAGES/DRESSINGS) ×2 IMPLANT
ELECT CAUTERY BLADE 6.4 (BLADE) ×2 IMPLANT
ELECT REM PT RETURN 9FT ADLT (ELECTROSURGICAL) ×2
ELECTRODE REM PT RTRN 9FT ADLT (ELECTROSURGICAL) ×1 IMPLANT
GLOVE BIOGEL PI IND STRL 8 (GLOVE) ×1 IMPLANT
GLOVE BIOGEL PI INDICATOR 8 (GLOVE) ×1
GLOVE ECLIPSE 8.0 STRL XLNG CF (GLOVE) ×2 IMPLANT
GOWN STRL REUS W/ TWL LRG LVL3 (GOWN DISPOSABLE) ×2 IMPLANT
GOWN STRL REUS W/TWL LRG LVL3 (GOWN DISPOSABLE) ×4
KIT BASIN OR (CUSTOM PROCEDURE TRAY) ×2 IMPLANT
KIT ROOM TURNOVER OR (KITS) ×2 IMPLANT
MESH HERNIA 3X6 (Mesh General) ×1 IMPLANT
NDL HYPO 25GX1X1/2 BEV (NEEDLE) ×1 IMPLANT
NEEDLE HYPO 25GX1X1/2 BEV (NEEDLE) ×2 IMPLANT
NS IRRIG 1000ML POUR BTL (IV SOLUTION) ×3 IMPLANT
PACK SURGICAL SETUP 50X90 (CUSTOM PROCEDURE TRAY) ×2 IMPLANT
PAD ARMBOARD 7.5X6 YLW CONV (MISCELLANEOUS) ×2 IMPLANT
PENCIL BUTTON HOLSTER BLD 10FT (ELECTRODE) ×2 IMPLANT
SPECIMEN JAR SMALL (MISCELLANEOUS) IMPLANT
SPONGE LAP 18X18 X RAY DECT (DISPOSABLE) ×2 IMPLANT
STRIP CLOSURE SKIN 1/2X4 (GAUZE/BANDAGES/DRESSINGS) ×2 IMPLANT
SUT MON AB 4-0 PC3 18 (SUTURE) ×2 IMPLANT
SUT PROLENE 2 0 CT2 30 (SUTURE) ×4 IMPLANT
SUT SILK 2 0 SH (SUTURE) IMPLANT
SUT VIC AB 2-0 SH 18 (SUTURE) ×2 IMPLANT
SUT VIC AB 3-0 SH 27 (SUTURE) ×4
SUT VIC AB 3-0 SH 27XBRD (SUTURE) ×2 IMPLANT
SUT VICRYL AB 3 0 TIES (SUTURE) ×2 IMPLANT
SYR BULB 3OZ (MISCELLANEOUS) ×1 IMPLANT
SYR CONTROL 10ML LL (SYRINGE) ×2 IMPLANT
TOWEL OR 17X24 6PK STRL BLUE (TOWEL DISPOSABLE) ×2 IMPLANT
TOWEL OR 17X26 10 PK STRL BLUE (TOWEL DISPOSABLE) ×2 IMPLANT
TUBE CONNECTING 12X1/4 (SUCTIONS) ×1 IMPLANT
YANKAUER SUCT BULB TIP NO VENT (SUCTIONS) ×1 IMPLANT

## 2014-01-16 NOTE — Anesthesia Preprocedure Evaluation (Addendum)
Anesthesia Evaluation  Patient identified by MRN, date of birth, ID band Patient awake    Reviewed: Allergy & Precautions, H&P , NPO status , Patient's Chart, lab work & pertinent test results  History of Anesthesia Complications Negative for: history of anesthetic complications  Airway Mallampati: II TM Distance: >3 FB Neck ROM: Full    Dental  (+) Teeth Intact, Dental Advisory Given   Pulmonary neg pulmonary ROS,  breath sounds clear to auscultation        Cardiovascular + CAD and + CABG Rhythm:Regular Rate:Normal     Neuro/Psych negative neurological ROS     GI/Hepatic Neg liver ROS, IBS   Endo/Other  negative endocrine ROS  Renal/GU negative Renal ROS     Musculoskeletal   Abdominal   Peds  Hematology negative hematology ROS (+)   Anesthesia Other Findings   Reproductive/Obstetrics negative OB ROS                         Anesthesia Physical Anesthesia Plan  ASA: III  Anesthesia Plan: General   Post-op Pain Management:    Induction: Intravenous  Airway Management Planned: LMA  Additional Equipment:   Intra-op Plan:   Post-operative Plan: Extubation in OR  Informed Consent: I have reviewed the patients History and Physical, chart, labs and discussed the procedure including the risks, benefits and alternatives for the proposed anesthesia with the patient or authorized representative who has indicated his/her understanding and acceptance.   Dental advisory given  Plan Discussed with: CRNA and Anesthesiologist  Anesthesia Plan Comments: (L. Inguinal hernia CAD S/P CABG 06/2009 no angina   Plan GA with LMA  Roberts Gaudy, MD)        Anesthesia Quick Evaluation

## 2014-01-16 NOTE — Anesthesia Procedure Notes (Signed)
Procedure Name: LMA Insertion Date/Time: 01/16/2014 7:49 AM Performed by: Luciana Axe K Pre-anesthesia Checklist: Patient identified, Emergency Drugs available, Suction available, Patient being monitored and Timeout performed Patient Re-evaluated:Patient Re-evaluated prior to inductionOxygen Delivery Method: Circle system utilized Preoxygenation: Pre-oxygenation with 100% oxygen Intubation Type: IV induction Ventilation: Mask ventilation without difficulty LMA: LMA inserted LMA Size: 5.0 Number of attempts: 1 Placement Confirmation: positive ETCO2,  CO2 detector and breath sounds checked- equal and bilateral Tube secured with: Tape Dental Injury: Teeth and Oropharynx as per pre-operative assessment  Comments: Soft bite block utilized

## 2014-01-16 NOTE — Interval H&P Note (Signed)
History and Physical Interval Note:  01/16/2014 7:35 AM  Travis Cook  has presented today for surgery, with the diagnosis of inguinal hernia  The various methods of treatment have been discussed with the patient and family. After consideration of risks, benefits and other options for treatment, the patient has consented to  Procedure(s):  LEFT INGUINAL HERNIA REPAIR WITH ON-Q PUMP PLACEMENT (Left) INSERTION OF MESH (Left) as a surgical intervention .  The patient's history has been reviewed, patient examined, no change in status, stable for surgery.  I have reviewed the patient's chart and labs.  Questions were answered to the patient's satisfaction.     Rhunette Croft Dasie Chancellor

## 2014-01-16 NOTE — Transfer of Care (Signed)
Immediate Anesthesia Transfer of Care Note  Patient: Travis Cook  Procedure(s) Performed: Procedure(s):  LEFT INGUINAL HERNIA REPAIR WITH ON-Q PUMP PLACEMENT (Left) INSERTION OF MESH (Left)  Patient Location: PACU  Anesthesia Type:General  Level of Consciousness: awake, alert , oriented and patient cooperative  Airway & Oxygen Therapy: Patient Spontanous Breathing and Patient connected to nasal cannula oxygen  Post-op Assessment: Report given to PACU RN and Post -op Vital signs reviewed and stable  Post vital signs: Reviewed  Complications: No apparent anesthesia complications

## 2014-01-16 NOTE — Discharge Instructions (Signed)
[facility] _______[facility]  UMBILICAL OR INGUINAL HERNIA REPAIR: POST OP INSTRUCTIONS  Always review your discharge instruction sheet given to you by the facility where your surgery was performed. IF YOU HAVE DISABILITY OR FAMILY LEAVE FORMS, YOU MUST BRING THEM TO THE OFFICE FOR PROCESSING.   DO NOT GIVE THEM TO YOUR DOCTOR.  1. A  prescription for pain medication may be given to you upon discharge.  Take your pain medication as prescribed, if needed.  If narcotic pain medicine is not needed, then you may take acetaminophen (Tylenol) or ibuprofen (Advil) as needed. 2. Take your usually prescribed medications unless otherwise directed. 3. If you need a refill on your pain medication, please contact your pharmacy.  They will contact our office to request authorization. Prescriptions will not be filled after 5 pm or on week-ends. 4. You should follow a light diet the first 24 hours after arrival home, such as soup and crackers, etc.  Be sure to include lots of fluids daily.  Resume your normal diet the day after surgery. 5. Most patients will experience some swelling and bruising around the umbilicus or in the groin and scrotum.  Ice packs and reclining will help.  Swelling and bruising can take several days to resolve.  6. It is common to experience some constipation if taking pain medication after surgery.  Increasing fluid intake and taking a stool softener (such as Colace) will usually help or prevent this problem from occurring.  A mild laxative (Milk of Magnesia or Miralax) should be taken according to package directions if there are no bowel movements after 48 hours. 7. Unless discharge instructions indicate otherwise, you may remove your bandages 01/18/14 at 9:00 AM , and you may shower at that time.  You may have steri-strips (small skin tapes) in place directly over the incision.  These strips should be left on the skin.  If your surgeon used skin glue on the incision, you may shower  in 24 hours.  The glue will flake off over the next 2-3 weeks.  Any sutures or staples will be removed at the office during your follow-up visit. 8. ACTIVITIES:  You may resume regular (light) daily activities beginning the next day--such as daily self-care, walking, climbing stairs--gradually increasing activities as tolerated.  You may have sexual intercourse when it is comfortable.  Refrain from any heavy lifting or straining-nothing over 10 pounds for 6 weeks.  a. You may drive when you are no longer taking prescription pain medication, you can comfortably wear a seatbelt, and you can safely maneuver your car and apply brakes. b. RETURN TO WORK:  Desk work/Light work in 1-2 weeks, full duty in 6 weeks. __________________________________________________________ 9. You should see your doctor in the office for a follow-up appointment approximately 2-3 weeks after your surgery.  Make sure that you call for this appointment within a day or two after you arrive home to insure a convenient appointment time. 10. OTHER INSTRUCTIONS:  __________________________________________________________________________________________________________________________________________________________________________________________  WHEN TO CALL YOUR DOCTOR: 1. Fever over 101.0 2. Inability to urinate 3. Nausea and/or vomiting 4. Extreme swelling or bruising 5. Continued bleeding from incision. 6. Increased pain, redness, or drainage from the incision  The clinic staff is available to answer your questions during regular business hours.  Please dont hesitate to call and ask to speak to one of the nurses for clinical concerns.  If you have a medical emergency, go to the nearest emergency room or call 911.  A surgeon from [employer]  [facility] is always on call at the hospital   [address] [place], [place], [place]  [address] ?  [address], [place], [place]   [address] [phone] ? [phone] ? FAX  [phone] Web site: [email or web address]

## 2014-01-16 NOTE — Anesthesia Postprocedure Evaluation (Signed)
  Anesthesia Post-op Note  Patient: Travis Cook  Procedure(s) Performed: Procedure(s):  LEFT INGUINAL HERNIA REPAIR WITH ON-Q PUMP PLACEMENT (Left) INSERTION OF MESH (Left)  Patient Location: PACU  Anesthesia Type:General  Level of Consciousness: awake, alert  and oriented  Airway and Oxygen Therapy: Patient Spontanous Breathing and Patient connected to nasal cannula oxygen  Post-op Pain: none  Post-op Assessment: Post-op Vital signs reviewed, Patient's Cardiovascular Status Stable, Respiratory Function Stable, Patent Airway and Pain level controlled  Post-op Vital Signs: stable  Last Vitals:  Filed Vitals:   01/16/14 0945  BP: 131/76  Pulse: 76  Temp:   Resp: 18    Complications: No apparent anesthesia complications

## 2014-01-16 NOTE — Op Note (Signed)
Travis Cook, August 03, 1961  Preoperative diagnosis:  Left inguinal hernia.  Postoperative diagnosis:  Same  Procedure:  Left inguinal hernia repair with mesh.  Placement of On-Q pump.  Surgeon:  Jackolyn Confer, M.D.  Anesthesia:  General/LMA with local (Marcaine).  Indication:  This is a 53 year old male who developed a painful left inguinal hernia. He now presents for repair.  Technique:  He was seen in the holding room and the left groin was marked with my initials. He was brought to the operating, placed supine on the operating table, and the anesthetic was administered by the anesthesiologist. The hair in the left groin area was clipped as was felt to be necessary. This area was then sterilely prepped and draped.  Local anesthetic was infiltrated in the superficial and deep tissues in the left groin.  An incision was made through the skin and subcutaneous tissue until the external oblique aponeurosis was identified.  Local anesthetic was infiltrated deep to the external oblique aponeurosis. The external oblique aponeurosis was divided through the external ring medially and back toward the anterior superior iliac spine laterally. Using blunt dissection, the shelving edge of the inguinal ligament was identified inferiorly and the internal oblique aponeurosis and muscle were identified superiorly. The ilioinguinal nerve was identified and preserved.  The spermatic cord was isolated and a posterior window was made around it. An indirect hernia sac was identified and separated from the spermatic cord using blunt dissection. The hernia sac and its contents were reduced through the indirect hernia defect.   A piece of 3" x 6" polypropylene mesh was brought into the field and anchored 1-2 cm medial to the pubic tubercle with 2-0 Prolene suture. The inferior aspect of the mesh was anchored to the shelving edge of the inguinal ligament with running 2-0 Prolene suture to a level 1-2 cm lateral to the  internal ring. A slit was cut in the mesh creating 2 tails. These were wrapped around the spermatic cord. The superior aspect of the mesh was anchored to the internal oblique aponeurosis and muscle with interrupted 2-0 Vicryl sutures. The 2 tails of the mesh were then crossed creating a new internal ring and were anchored to the shelving edge of the inguinal ligament with 2-0 Prolene suture. The tip of a hemostat could be placed through the new aperture. The lateral aspect of the mesh was then tucked deep to the external oblique aponeurosis.  The On Q catheter was then inserted into the skin lateral to the large incision and placed just above the mesh. It was primed with half percent Marcaine solution.  The wound was inspected and hemostasis was adequate. The external oblique aponeurosis was then closed over the mesh and cord with running 3-0 Vicryl suture. The subcutaneous tissue was closed with running 3-0 Vicryl suture. The skin closed with a running 4-0 Monocryl subcuticular stitch.  Steri-Strips and a sterile dressing were applied to the incision and the On Q catheter site.  The catheter was hooked up to the reservoir bulb.  The procedure was well-tolerated without any apparent complications and he was taken to the recovery room in satisfactory condition.

## 2014-01-16 NOTE — H&P (View-Only) (Signed)
Patient ID: Travis Cook, male   DOB: 1961-03-07, 53 y.o.   MRN: [id number]  Chief Complaint  Patient presents with  . New Evaluation    eval LIH    HPI Travis Cook is a 53 y.o. male.   HPI  He is self-referred. He has some significant sneezing then noticed a bulge in the left groin area. It is getting slightly larger. Sometimes he has to manually reduce it. It causes mild discomfort. He has alters his activities because of the bulge. It does not interfere with him going to the bathroom. He does not have to strain to urinate. He denies constipation. He is very active normally. He states he thinks he may have had a hernia repair as a baby.  Past Medical History  Diagnosis Date  . CAD (coronary artery disease) 2010    CAD  . HYPERLIPIDEMIA 08/29/2007    Qualifier: Diagnosis of  By: Scherrie Gerlach    . NEPHROLITHIASIS, HX OF 08/29/2007    Qualifier: Diagnosis of  By: Scherrie Gerlach      Past Surgical History  Procedure Laterality Date  . Heart bypass      3 vessels 2010  . Lithotripsy 2005      Family History  Problem Relation Age of Onset  . Heart disease Father 68    CAD    Social History History  Substance Use Topics  . Smoking status: Never Smoker   . Smokeless tobacco: Never Used  . Alcohol Use: 3.0 - 3.6 oz/week    5-6 Glasses of wine per week    No Known Allergies  Current Outpatient Prescriptions  Medication Sig Dispense Refill  . aspirin 325 MG tablet Take 325 mg by mouth daily.      [name] atorvastatin (LIPITOR) 10 MG tablet TAKE 1 TABLET BY MOUTH DAILY  30 tablet  0  . zolpidem (AMBIEN) 10 MG tablet TAKE 1 TABLET AT BEDTIME AS NEEDED FOR SLEEP  15 tablet  0   No current facility-administered medications for this visit.    Review of Systems Review of Systems  HENT: Negative.   Respiratory: Negative.   Cardiovascular: Negative.   Gastrointestinal: Negative.   Genitourinary: Negative.     Blood pressure 110/70, pulse 68, temperature 97 F (36.1 C),  temperature source Temporal, resp. rate 14, height 5' 8.5" (1.74 m), weight 171 lb (77.565 kg).  Physical Exam Physical Exam  Constitutional: He appears well-developed and well-nourished. No distress.  HENT:  Head: Normocephalic and atraumatic.  Cardiovascular: Normal rate and regular rhythm.   Pulmonary/Chest: Effort normal and breath sounds normal.  Abdominal: Soft.  No umbilical bulge.  Genitourinary:  Reducible left inguinal bulge. No right inguinal bulge.  Neurological: He is alert.  Skin: Skin is warm and dry.  Psychiatric: He has a normal mood and affect. His behavior is normal.    Data Reviewed Note from Dr. Zannie Cook  Assessment    Symptomatic left inguinal hernia. He is interested in repair. We discussed open and laparoscopic repairs.     Plan    Open left inguinal hernia repair with mesh and OnQ pump placement. Will communicate with his cardiologist preoperatively to make sure there are no contraindications to the surgery.  I have explained the procedure, risks, and aftercare of inguinal hernia repair.  Risks include but are not limited to bleeding, infection, wound problems, anesthesia, recurrence, bladder or intestine injury, urinary retention, testicular dysfunction, chronic pain, mesh problems.  He/she seems to understand and  agrees with the plan.       Rhunette Croft Travis Cook 01/06/2014, 2:51 PM

## 2014-01-20 ENCOUNTER — Encounter (HOSPITAL_COMMUNITY): Payer: Self-pay | Admitting: General Surgery

## 2014-01-20 ENCOUNTER — Telehealth (INDEPENDENT_AMBULATORY_CARE_PROVIDER_SITE_OTHER): Payer: Self-pay | Admitting: General Surgery

## 2014-01-20 MED ORDER — OXYCODONE HCL 5 MG PO TABS: 5.0000 mg | ORAL_TABLET | ORAL | Status: DC | PRN

## 2014-01-20 NOTE — Telephone Encounter (Signed)
May give refill:  Oxy IR 5 mg, 1-2 po q4h prn pain, disp # 40.  Also, Ibuprofen 600 mg every 6h and continue using ice frequently.

## 2014-01-20 NOTE — Telephone Encounter (Signed)
Called pt back to inform her that Dr. [name] agreed to refill the Rx for her husband.  Informed them it will be ready for pickup at our front desk after 2:30 today.

## 2014-01-20 NOTE — Telephone Encounter (Signed)
Patient's wife called in to explain that he husband is still having pain at an 8 out of 10 and needs a refill on the pain medication.  He is s/p LIH on 01/16/14. Informed her that we would send this message to Dr. [name] and if he okays the prescription than we can have our urgent office doctor sign for it.  Informed her we would give them a call once we have heard back from Dr. [name]. The patient's wife verbalized understanding.

## 2014-01-20 NOTE — Addendum Note (Signed)
Addended by: Flossie Buffy on: 01/20/2014 10:05 AM   Modules accepted: Orders

## 2014-03-02 ENCOUNTER — Ambulatory Visit (INDEPENDENT_AMBULATORY_CARE_PROVIDER_SITE_OTHER): Payer: Federal, State, Local not specified - PPO | Admitting: General Surgery

## 2014-03-02 DIAGNOSIS — Z4889 Encounter for other specified surgical aftercare: Secondary | ICD-10-CM

## 2014-03-02 NOTE — Progress Notes (Signed)
He presents for postop followup after open left inguinal hernia repair with mesh 01/16/14.  He is doing well. He is moving well. He has been traveling without any difficulty.  P.E.  GU:  Left groin ncision clean/dry/intact, swelling is minimal, repair is solid.  Assessment:  Doing well post hernia repair.  Plan:  Resume normal activities as tolerated.  Return visit as needed.

## 2014-03-02 NOTE — Patient Instructions (Signed)
May resume normal activities as tolerated, as discussed.  

## 2014-04-16 ENCOUNTER — Other Ambulatory Visit: Payer: Self-pay | Admitting: Internal Medicine

## 2014-06-01 ENCOUNTER — Ambulatory Visit (INDEPENDENT_AMBULATORY_CARE_PROVIDER_SITE_OTHER): Payer: Federal, State, Local not specified - PPO | Admitting: Cardiovascular Disease

## 2014-06-01 ENCOUNTER — Encounter: Payer: Self-pay | Admitting: Cardiovascular Disease

## 2014-06-01 VITALS — BP 122/82 | HR 71 | Ht 69.0 in | Wt 182.4 lb

## 2014-06-01 DIAGNOSIS — I25810 Atherosclerosis of coronary artery bypass graft(s) without angina pectoris: Secondary | ICD-10-CM

## 2014-06-01 DIAGNOSIS — Z79899 Other long term (current) drug therapy: Secondary | ICD-10-CM

## 2014-06-01 DIAGNOSIS — E785 Hyperlipidemia, unspecified: Secondary | ICD-10-CM

## 2014-06-01 MED ORDER — NITROGLYCERIN 0.4 MG SL SUBL: 0.4000 mg | SUBLINGUAL_TABLET | SUBLINGUAL | Status: DC | PRN

## 2014-06-01 NOTE — Patient Instructions (Signed)
Your physician wants you to follow-up in:  [place] will receive a reminder letter in the mail two months in advance. If you don't receive a letter, please call our office to schedule the follow-up appointment. Your physician recommends that you return for lab work in:  [place]

## 2014-06-01 NOTE — Progress Notes (Signed)
Patient ID: Travis Cook, male   DOB: [birth year], 53 y.o.   MRN: [id number] Travis Cook is seen today post CABG by Dr Travis Cook. He is doing well. His BP and HR always run low and I think we can stop his BB all together now. He has been taking an aspirin. His sternum has healed well. He is not having angina, palpitations, dyspnea or edema. F/U ETT 3/11 showed normal BP and HR response. Lipids followed by Dr Travis Cook have been good with normal LFTS' He has a lot of traveling to do this month.  CABG: 11/10 Travis Cook [name] to [name], [name] OM and [name] to [name]   7/15 had left inguinal surgery repair with Dr Travis Cook with no complications   4/16  LDL is 97 with normal LFTls except mild increase in TB  ROS: Denies fever, malais, weight loss, blurry vision, decreased visual acuity, cough, sputum, SOB, hemoptysis, pleuritic pain, palpitaitons, heartburn, abdominal pain, melena, lower extremity edema, claudication, or rash.  All other systems reviewed and negative  General: Affect appropriate Healthy:  appears stated age 53: normal Neck supple with no adenopathy JVP normal no bruits no thyromegaly Lungs clear with no wheezing and good diaphragmatic motion Heart:  S1/S2 no murmur, no rub, gallop or click PMI normal Abdomen: benighn, BS positve, no tenderness, no AAA left inguinal hernia scar  no bruit.  No HSM or HJR Distal pulses intact with no bruits No edema Neuro non-focal Skin warm and dry No muscular weakness   Current Outpatient Prescriptions  Medication Sig Dispense Refill  . aspirin 325 MG tablet Take 325 mg by mouth daily.      [name] atorvastatin (LIPITOR) 10 MG tablet Take 10 mg by mouth daily.      . Multiple Vitamins-Minerals (MULTIVITAMIN WITH MINERALS) tablet Take 1 tablet by mouth daily.      [name] oxyCODONE (OXY IR/ROXICODONE) 5 MG immediate release tablet Take 1-2 tablets (5-10 mg total) by mouth every 4 (four) hours as needed.  40 tablet  0  . zolpidem (AMBIEN) 10 MG tablet TAKE 1 TABLET BY  MOUTH AT BEDTIME AS NEEDED FOR SLEEP  15 tablet  0   No current facility-administered medications for this visit.    Allergies  Review of patient's allergies indicates no known allergies.  Electrocardiogram:  SR rate 60 LAE nonspecific ST T wave changes   Assessment and Plan

## 2014-06-01 NOTE — Assessment & Plan Note (Addendum)
Stable with no angina and good activity level.  Continue medical Rx SL nitro called in

## 2014-06-01 NOTE — Assessment & Plan Note (Signed)
Cholesterol is at goal.  Continue current dose of statin and diet Rx.  No myalgias or side effects.  F/U  LFT's in 6 months. Lab Results  Component Value Date   LDLCALC 97 08/15/2013   Needs f/u labs next week Compliant with statin

## 2014-06-18 ENCOUNTER — Telehealth: Payer: Self-pay

## 2014-06-18 NOTE — Telephone Encounter (Signed)
Ok to refill ambien

## 2014-06-19 ENCOUNTER — Other Ambulatory Visit: Payer: Self-pay

## 2014-06-19 MED ORDER — ZOLPIDEM TARTRATE 10 MG PO TABS: ORAL_TABLET | ORAL | Status: AC

## 2014-06-23 NOTE — Telephone Encounter (Signed)
SCRIPT FOR AMBIEN  10 MG  NUMBER  30 WITH   3  REFILLS CALLED  IN .Adonis Housekeeper

## 2014-12-17 ENCOUNTER — Other Ambulatory Visit: Payer: Self-pay | Admitting: Internal Medicine

## 2015-08-18 NOTE — Progress Notes (Signed)
Patient ID: Travis Cook, male   DOB: [birth year], 55 y.o.   MRN: [id number]   Wai is seen today post CABG by [name]. He is doing well. His BP and HR always run low and I think we can stop his BB all together now. He has been taking an aspirin. His sternum has healed well. He is not having angina, palpitations, dyspnea or edema. F/U ETT 3/11 showed normal BP and HR response.    CABG: 06/2009 [name] [name] to [name], [name] OM and [name] to [name]  7/15 had left inguinal surgery repair with [name] with no complications   Sees [facility] for primary care now  1/15  LDL is 97 with normal LFTls except mild increase in TB  Doing Alpine ski racing again.  Routinely gets HR over 200 with no angina Daughter unfortunately had first child die at one month with Trisomy 18   ROS: Denies fever, malais, weight loss, blurry vision, decreased visual acuity, cough, sputum, SOB, hemoptysis, pleuritic pain, palpitaitons, heartburn, abdominal pain, melena, lower extremity edema, claudication, or rash.  All other systems reviewed and negative  General: Affect appropriate Healthy:  appears stated age 79: normal Neck supple with no adenopathy JVP normal no bruits no thyromegaly Lungs clear with no wheezing and good diaphragmatic motion Heart:  S1/S2 no murmur, no rub, gallop or click PMI normal Abdomen: benighn, BS positve, no tenderness, no AAA left inguinal hernia scar  no bruit.  No HSM or HJR Distal pulses intact with no bruits No edema Neuro non-focal Skin warm and dry No muscular weakness   Current Outpatient Prescriptions  Medication Sig Dispense Refill  . aspirin 325 MG tablet Take 325 mg by mouth daily.    [name] atorvastatin (LIPITOR) 10 MG tablet Take 10 mg by mouth daily.    . Multiple Vitamins-Minerals (MULTIVITAMIN WITH MINERALS) tablet Take 1 tablet by mouth daily.    . nitroGLYCERIN (NITROSTAT) 0.4 MG SL tablet Place 1 tablet (0.4 mg total) under the tongue every 5 (five) minutes as  needed for chest pain. 25 tablet 3  . zolpidem (AMBIEN) 10 MG tablet TAKE 1 TABLET BY MOUTH AT BEDTIME AS NEEDED FOR SLEEP 15 tablet 3   No current facility-administered medications for this visit.    Allergies  Review of patient's allergies indicates no known allergies.  Electrocardiogram:  SR rate 60 LAE nonspecific ST T wave changes   Assessment and Plan CAD/CABG:  2010  Stable no angina continue ASA/Statin  Baseline HR low so no beta blocker.  Consider f/u stress test in a year Chol:  Will call [place] office for most recent labs continue statin  Anxiety/Depression:  Still seems to have issues with stress  F/u primary consider SSRI  F/u with me in a year  [name]

## 2015-08-19 ENCOUNTER — Encounter: Payer: Self-pay | Admitting: Cardiovascular Disease

## 2015-08-20 ENCOUNTER — Ambulatory Visit (INDEPENDENT_AMBULATORY_CARE_PROVIDER_SITE_OTHER): Payer: BLUE CROSS/BLUE SHIELD | Admitting: Cardiovascular Disease

## 2015-08-20 ENCOUNTER — Encounter: Payer: Self-pay | Admitting: Cardiovascular Disease

## 2015-08-20 VITALS — BP 106/70 | HR 61 | Ht 69.0 in | Wt 176.4 lb

## 2015-08-20 DIAGNOSIS — E785 Hyperlipidemia, unspecified: Secondary | ICD-10-CM

## 2015-08-20 DIAGNOSIS — Z79899 Other long term (current) drug therapy: Secondary | ICD-10-CM

## 2015-08-20 NOTE — Patient Instructions (Addendum)

## 2015-08-26 ENCOUNTER — Telehealth: Payer: Self-pay | Admitting: Cardiovascular Disease

## 2015-08-26 NOTE — Telephone Encounter (Signed)
Patient's wife was concerned about the EKG at office visit. Informed patient's wife that she is not a DPR, so I would be unable to give her any medical information about her husband. Requested a DPR form, will send in mail.

## 2015-08-26 NOTE — Telephone Encounter (Signed)
NewMessage  Pt wife requested do speak w/ RN- to discuss previous OV- EKG, Please call back and discuss.

## 2015-08-26 NOTE — Telephone Encounter (Signed)
Left message to call back  

## 2015-12-14 ENCOUNTER — Encounter: Payer: Self-pay | Admitting: Gastroenterology

## 2016-02-01 ENCOUNTER — Encounter: Payer: Self-pay | Admitting: Gastroenterology

## 2016-04-05 ENCOUNTER — Ambulatory Visit (AMBULATORY_SURGERY_CENTER): Payer: Self-pay | Admitting: *Deleted

## 2016-04-05 ENCOUNTER — Encounter: Payer: Self-pay | Admitting: Gastroenterology

## 2016-04-05 VITALS — Ht 68.5 in | Wt 178.8 lb

## 2016-04-05 DIAGNOSIS — Z86010 Personal history of colonic polyps: Secondary | ICD-10-CM

## 2016-04-05 MED ORDER — NA SULFATE-K SULFATE-MG SULF 17.5-3.13-1.6 GM/177ML PO SOLN: 1.0000 | Freq: Once | ORAL | 0 refills | Status: AC

## 2016-04-05 NOTE — Progress Notes (Signed)
No egg or soy allergy known to patient  No issues with past sedation with any surgeries  or procedures, no intubation problems  No diet pills per patient No home 02 use per patient  No blood thinners per patient  Pt denies issues with constipation  No A fib or A flutter   

## 2016-04-11 ENCOUNTER — Telehealth: Payer: Self-pay | Admitting: Gastroenterology

## 2016-04-11 NOTE — Telephone Encounter (Signed)
Spoke with patient's emergency contact and told her that, while we did not do prior authorizations on preps, I could call her pharmacy with a coupon that would bring the price down to $50.  She agreed.  Called [facility] and gave pharmacist the information on the coupon, she ran it, and it was approved for $50.

## 2016-04-14 ENCOUNTER — Ambulatory Visit (AMBULATORY_SURGERY_CENTER): Payer: BLUE CROSS/BLUE SHIELD | Admitting: Gastroenterology

## 2016-04-14 ENCOUNTER — Encounter: Payer: Self-pay | Admitting: Gastroenterology

## 2016-04-14 VITALS — BP 102/63 | HR 57 | Temp 97.3°F | Resp 13 | Ht 68.5 in | Wt 178.0 lb

## 2016-04-14 DIAGNOSIS — Z86010 Personal history of colonic polyps: Secondary | ICD-10-CM | POA: Diagnosis not present

## 2016-04-14 MED ORDER — SODIUM CHLORIDE 0.9 % IV SOLN: 500.0000 mL | INTRAVENOUS | Status: DC

## 2016-04-14 NOTE — Progress Notes (Signed)
A/ox3 pleased with MAC, report to Robbin RN 

## 2016-04-14 NOTE — Op Note (Signed)
[place] Patient Name: Travis Cook Procedure Date: 04/14/2016 8:05 AM MRN: [id number] Endoscopist: Milus Banister , MD Age: 56 Referring MD:  Date of Birth: [birth year] Gender: Male Account #: [id number] Procedure:                Colonoscopy Indications:              High risk colon cancer surveillance: Personal                            history of colonic polyps (single subCM TA removed                            2012 Colonoscopy Dr. Ardis Hughs) Medicines:                Monitored Anesthesia Care Procedure:                Pre-Anesthesia Assessment:                           - Prior to the procedure, a History and Physical                            was performed, and patient medications and                            allergies were reviewed. The patient's tolerance of                            previous anesthesia was also reviewed. The risks                            and benefits of the procedure and the sedation                            options and risks were discussed with the patient.                            All questions were answered, and informed consent                            was obtained. Prior Anticoagulants: The patient has                            taken no previous anticoagulant or antiplatelet                            agents. ASA Grade Assessment: II - A patient with                            mild systemic disease. After reviewing the risks                            and benefits, the patient was deemed in  satisfactory condition to undergo the procedure.                           After obtaining informed consent, the colonoscope                            was passed under direct vision. Throughout the                            procedure, the patient's blood pressure, pulse, and                            oxygen saturations were monitored continuously. The                            Model CF-HQ190L [id number]) scope  was introduced                            through the anus and advanced to the the cecum,                            identified by appendiceal orifice and ileocecal                            valve. The colonoscopy was performed without                            difficulty. The patient tolerated the procedure                            well. The quality of the bowel preparation was                            excellent. The ileocecal valve, appendiceal                            orifice, and rectum were photographed. Scope In: 8:16:11 AM Scope Out: 8:31:28 AM Scope Withdrawal Time: 0 hours 11 minutes 42 seconds  Total Procedure Duration: 0 hours 15 minutes 17 seconds  Findings:                 The entire examined colon appeared normal on direct                            and retroflexion views. Complications:            No immediate complications. Estimated blood loss:                            None. Estimated Blood Loss:     Estimated blood loss: none. Impression:               - The entire examined colon is normal on direct and                            retroflexion  views.                           - No polyps or cancers Recommendation:           - Patient has a contact number available for                            emergencies. The signs and symptoms of potential                            delayed complications were discussed with the                            patient. Return to normal activities tomorrow.                            Written discharge instructions were provided to the                            patient.                           - Resume previous diet.                           - Continue present medications.                           - Repeat colonoscopy in 10 years for screening                            purposes. Milus Banister, MD 04/14/2016 8:37:25 AM This report has been signed electronically.

## 2016-04-14 NOTE — Patient Instructions (Signed)
Normal colon exam today. Repeat colonoscopy in 10 years. Resume current medications.  Call us with any questions or concerns. Thank you!  YOU HAD AN ENDOSCOPIC PROCEDURE TODAY AT [facility]:   Refer to the procedure report that was given to you for any specific questions about what was found during the examination.  If the procedure report does not answer your questions, please call your gastroenterologist to clarify.  If you requested that your care partner not be given the details of your procedure findings, then the procedure report has been included in a sealed envelope for you to review at your convenience later.  YOU SHOULD EXPECT: Some feelings of bloating in the abdomen. Passage of more gas than usual.  Walking can help get rid of the air that was put into your GI tract during the procedure and reduce the bloating. If you had a lower endoscopy (such as a colonoscopy or flexible sigmoidoscopy) you may notice spotting of blood in your stool or on the toilet paper. If you underwent a bowel prep for your procedure, you may not have a normal bowel movement for a few days.  Please Note:  You might notice some irritation and congestion in your nose or some drainage.  This is from the oxygen used during your procedure.  There is no need for concern and it should clear up in a day or so.  SYMPTOMS TO REPORT IMMEDIATELY:   Following lower endoscopy (colonoscopy or flexible sigmoidoscopy):  Excessive amounts of blood in the stool  Significant tenderness or worsening of abdominal pains  Swelling of the abdomen that is new, acute  Fever of 100F or higher  For urgent or emergent issues, a gastroenterologist can be reached at any hour by calling [phone].   DIET:  We do recommend a small meal at first, but then you may proceed to your regular diet.  Drink plenty of fluids but you should avoid alcoholic beverages for 24 hours.  ACTIVITY:  You should plan to take it easy for  the rest of today and you should NOT DRIVE or use heavy machinery until tomorrow (because of the sedation medicines used during the test).    FOLLOW UP: Our staff will call the number listed on your records the next business day following your procedure to check on you and address any questions or concerns that you may have regarding the information given to you following your procedure. If we do not reach you, we will leave a message.  However, if you are feeling well and you are not experiencing any problems, there is no need to return our call.  We will assume that you have returned to your regular daily activities without incident.  If any biopsies were taken you will be contacted by phone or by letter within the next 1-3 weeks.  Please call us at [phone] if you have not heard about the biopsies in 3 weeks.    SIGNATURES/CONFIDENTIALITY: You and/or your care partner have signed paperwork which will be entered into your electronic medical record.  These signatures attest to the fact that that the information above on your After Visit Summary has been reviewed and is understood.  Full responsibility of the confidentiality of this discharge information lies with you and/or your care-partner.

## 2016-04-18 ENCOUNTER — Telehealth: Payer: Self-pay

## 2016-04-18 NOTE — Telephone Encounter (Signed)
  Follow up Call-  Call back number 04/14/2016  Post procedure Call Back phone  # [phone]  Permission to leave phone message Yes  Some recent data might be hidden     Patient questions:  Do you have a fever, pain , or abdominal swelling? No. Pain Score  0 *  Have you tolerated food without any problems? Yes.    Have you been able to return to your normal activities? Yes.    Do you have any questions about your discharge instructions: Diet   No. Medications  No. Follow up visit  No.  Do you have questions or concerns about your Care? No.  Actions: * If pain score is 4 or above: No action needed, pain <4.

## 2016-09-20 NOTE — Progress Notes (Deleted)
Patient ID: Travis Cook, male   DOB: June 02, 1961, 56 y.o.   MRN: [id number]   Arvon is seen today post CABG by [name]. He is doing well. His BP and HR always run low and beta blocker stopped post op  . He has been taking an aspirin.  CABG: 06/2009 [name] [name] to [name], [name] OM and [name] to [name]  7/15 had left inguinal surgery repair with [name] with no complications   Sees [facility] for primary care now  1/15  LDL is 97 with normal LFTls except mild increase in TB  Doing Alpine ski racing again.  Routinely gets HR over 200 with no angina Daughter unfortunately had first child die at one month with Trisomy 18   ROS: Denies fever, malais, weight loss, blurry vision, decreased visual acuity, cough, sputum, SOB, hemoptysis, pleuritic pain, palpitaitons, heartburn, abdominal pain, melena, lower extremity edema, claudication, or rash.  All other systems reviewed and negative  General: Affect appropriate Healthy:  appears stated age 50: normal Neck supple with no adenopathy JVP normal no bruits no thyromegaly Lungs clear with no wheezing and good diaphragmatic motion Heart:  S1/S2 no murmur, no rub, gallop or click PMI normal Abdomen: benighn, BS positve, no tenderness, no AAA left inguinal hernia scar  no bruit.  No HSM or HJR Distal pulses intact with no bruits No edema Neuro non-focal Skin warm and dry No muscular weakness   Current Outpatient Prescriptions  Medication Sig Dispense Refill  . aspirin 325 MG tablet Take 325 mg by mouth daily.    [name] atorvastatin (LIPITOR) 10 MG tablet Take 10 mg by mouth daily.    . Multiple Vitamins-Minerals (MULTIVITAMIN WITH MINERALS) tablet Take 1 tablet by mouth daily.    . nitroGLYCERIN (NITROSTAT) 0.4 MG SL tablet Place 1 tablet (0.4 mg total) under the tongue every 5 (five) minutes as needed for chest pain. (Patient not taking: Reported on 04/05/2016) 25 tablet 3  . zolpidem (AMBIEN) 10 MG tablet TAKE 1 TABLET BY MOUTH AT BEDTIME AS  NEEDED FOR SLEEP 15 tablet 3   Current Facility-Administered Medications  Medication Dose Route Frequency Provider Last Rate Last Dose  . 0.9 %  sodium chloride infusion  500 mL Intravenous Continuous Milus Banister, MD        Allergies  Patient has no known allergies.  Electrocardiogram:  08/20/15 SR rate 60 LAE nonspecific ST T wave changes   Assessment and Plan CAD/CABG:  2010  Stable no angina continue ASA/Statin  Baseline HR low so no beta blocker.  Consider f/u stress test in a year Chol:  Will call [place] office for most recent labs continue statin  Anxiety/Depression:  Still seems to have issues with stress  F/u primary consider SSRI  F/u with me in a year  [name]

## 2016-09-25 ENCOUNTER — Encounter: Payer: Self-pay | Admitting: Cardiovascular Disease

## 2016-10-02 ENCOUNTER — Ambulatory Visit: Payer: BLUE CROSS/BLUE SHIELD | Admitting: Cardiovascular Disease

## 2016-10-31 NOTE — Progress Notes (Signed)
Patient ID: Travis Cook, male   DOB: 1961-04-01, 56 y.o.   MRN: [id number]   Travis Cook is seen today post CABG by Travis Cook. He is doing well. His BP and HR always run low and beta blocker stopped post op  . He has been taking an aspirin.  CABG: 06/2009 Travis Cook [name] to [name], [name] OM and [name] to [name]  7/15 had left inguinal surgery repair with Travis Travis Cook with no complications   Travis Cook for primary care now  Travis Cook med follows labs  Lab Results  Component Value Date   Travis Cook 97 08/15/2013   Daughter unfortunately had first child die at one month with Trisomy 18   In office today in afib. He has had some unsettled feelings last 2 weeks and only in retrospect Does he think this has involved irregular heart rate. No TIA, palpitations, syncope. Stamina level About same  Long discussion about afib , rate control , anticoagulation and conversion.  This patients CHA2DS2-VASc Score and unadjusted Ischemic Stroke Rate (% per year) is equal to 2.2 % stroke rate/year from a score of 2  Above score calculated as 1 point each if present [CHF, HTN, DM, Vascular=MI/PAD/Aortic Plaque, Age if 65-74, or Male] Above score calculated as 2 points each if present [Age > 75, or Stroke/TIA/TE]    ROS: Denies fever, malais, weight loss, blurry vision, decreased visual acuity, cough, sputum, SOB, hemoptysis, pleuritic pain, palpitaitons, heartburn, abdominal pain, melena, lower extremity edema, claudication, or rash.  All other systems reviewed and negative  General: Affect appropriate Healthy:  appears stated age 49: normal Neck supple with no adenopathy JVP normal no bruits no thyromegaly Lungs clear with no wheezing and good diaphragmatic motion Heart:  S1/S2 no murmur, no rub, gallop or click PMI normal Abdomen: benighn, BS positve, no tenderness, no AAA left inguinal hernia scar  no bruit.  No HSM or HJR Distal pulses intact with no bruits No edema Neuro non-focal Skin warm and  dry No muscular weakness   Current Outpatient Prescriptions  Medication Sig Dispense Refill  . aspirin EC 81 MG tablet Take 81 mg by mouth daily.    [name] atorvastatin (LIPITOR) 10 MG tablet Take 10 mg by mouth daily.    . Multiple Vitamins-Minerals (MULTIVITAMIN WITH MINERALS) tablet Take 1 tablet by mouth daily.    . nitroGLYCERIN (NITROSTAT) 0.4 MG SL tablet Place 1 tablet (0.4 mg total) under the tongue every 5 (five) minutes as needed for chest pain. 25 tablet 3  . zolpidem (AMBIEN) 10 MG tablet TAKE 1 TABLET BY MOUTH AT BEDTIME AS NEEDED FOR SLEEP 15 tablet 3  . rivaroxaban (XARELTO) 20 MG TABS tablet Take 1 tablet (20 mg total) by mouth daily with supper. 90 tablet 3   Current Facility-Administered Medications  Medication Dose Route Frequency Provider Last Rate Last Dose  . 0.9 %  sodium chloride infusion  500 mL Intravenous Continuous Travis Banister, MD        Allergies  Patient has no known allergies.  Electrocardiogram:  08/20/15 SR rate 60 LAE nonspecific ST T wave changes  11/03/16 Afib rate 86 PVC afib Is new compared to previous  Assessment and Plan CAD/CABG:  2010  Stable no angina continue ASA/Statin  Baseline HR low so no beta blocker. Given new onset afib will check exercise myovue   Chol:  Will call Travis Cook office for most recent labs continue statin   Anxiety/Depression:  Still seems to have issues with stress  F/u  primary consider SSRI  Afib:  No need for AV blocking agent start xarelto. Labs today . Showed him how to take pulse to see if he is going In / out of rhythm He is going to [place] for a week and when he comes back will do myovue / echo in light of new afib.  Discuss cardioversion after 4 weeks of anticoagulation Also discussed precautions he needs to take while On NOAC in regard to trauma  Continue 81 mg ASA in light of CAD/CABG  F/u with me next available   [employer]

## 2016-11-03 ENCOUNTER — Encounter: Payer: Self-pay | Admitting: Cardiovascular Disease

## 2016-11-03 ENCOUNTER — Ambulatory Visit (INDEPENDENT_AMBULATORY_CARE_PROVIDER_SITE_OTHER): Payer: 59 | Admitting: Cardiovascular Disease

## 2016-11-03 ENCOUNTER — Telehealth: Payer: Self-pay | Admitting: Cardiovascular Disease

## 2016-11-03 VITALS — BP 100/70 | HR 82 | Ht 69.0 in | Wt 182.0 lb

## 2016-11-03 DIAGNOSIS — Z79899 Other long term (current) drug therapy: Secondary | ICD-10-CM

## 2016-11-03 DIAGNOSIS — I25810 Atherosclerosis of coronary artery bypass graft(s) without angina pectoris: Secondary | ICD-10-CM

## 2016-11-03 DIAGNOSIS — E785 Hyperlipidemia, unspecified: Secondary | ICD-10-CM

## 2016-11-03 DIAGNOSIS — I4891 Unspecified atrial fibrillation: Secondary | ICD-10-CM

## 2016-11-03 LAB — COMPREHENSIVE METABOLIC PANEL WITH GFR
ALT: 33 IU/L (ref 0–44)
AST: 23 IU/L (ref 0–40)
Albumin/Globulin Ratio: 2.1 (ref 1.2–2.2)
Albumin: 4.5 g/dL (ref 3.5–5.5)
Alkaline Phosphatase: 71 IU/L (ref 39–117)
BUN/Creatinine Ratio: 13 (ref 9–20)
BUN: 12 mg/dL (ref 6–24)
Bilirubin Total: 0.3 mg/dL (ref 0.0–1.2)
CO2: 19 mmol/L (ref 18–29)
Calcium: 9.5 mg/dL (ref 8.7–10.2)
Chloride: 102 mmol/L (ref 96–106)
Creatinine, Ser: 0.93 mg/dL (ref 0.76–1.27)
GFR calc Af Amer: 105 mL/min/1.73
GFR calc non Af Amer: 91 mL/min/1.73
Globulin, Total: 2.1 g/dL (ref 1.5–4.5)
Glucose: 89 mg/dL (ref 65–99)
Potassium: 4.6 mmol/L (ref 3.5–5.2)
Sodium: 141 mmol/L (ref 134–144)
Total Protein: 6.6 g/dL (ref 6.0–8.5)

## 2016-11-03 LAB — CBC WITH DIFFERENTIAL/PLATELET
Basophils Absolute: 0 10*3/uL (ref 0.0–0.2)
Basos: 0 %
EOS (ABSOLUTE): 0.1 10*3/uL (ref 0.0–0.4)
EOS: 2 %
Hematocrit: 42.2 % (ref 37.5–51.0)
Hemoglobin: 14.5 g/dL (ref 13.0–17.7)
IMMATURE GRANULOCYTES: 0 %
Immature Grans (Abs): 0 10*3/uL (ref 0.0–0.1)
LYMPHS: 30 %
Lymphocytes Absolute: 1.8 10*3/uL (ref 0.7–3.1)
MCH: 30.7 pg (ref 26.6–33.0)
MCHC: 34.4 g/dL (ref 31.5–35.7)
MCV: 89 fL (ref 79–97)
MONOS ABS: 0.7 10*3/uL (ref 0.1–0.9)
Monocytes: 11 %
Neutrophils Absolute: 3.4 10*3/uL (ref 1.4–7.0)
Neutrophils: 57 %
PLATELETS: 188 10*3/uL (ref 150–379)
RBC: 4.73 x10E6/uL (ref 4.14–5.80)
RDW: 13.5 % (ref 12.3–15.4)
WBC: 6 10*3/uL (ref 3.4–10.8)

## 2016-11-03 LAB — PROTIME-INR
INR: 1 (ref 0.8–1.2)
Prothrombin Time: 10.5 s (ref 9.1–12.0)

## 2016-11-03 LAB — APTT: aPTT: 27 s (ref 24–33)

## 2016-11-03 MED ORDER — RIVAROXABAN 20 MG PO TABS: 20.0000 mg | ORAL_TABLET | Freq: Every day | ORAL | 3 refills | Status: DC

## 2016-11-03 NOTE — Telephone Encounter (Signed)
New message    Pt wife is calling to find out if blood work came back to confirm that pt is to start new medication prescribed today.

## 2016-11-03 NOTE — Telephone Encounter (Signed)
Informed patient's wife that labs have not been reviewed by MD yet. She and the patient are going to [place] Sunday night.  She requests Dr. Kyla Balzarine nurse send a MyChart message on Monday to let them know whether or not to start the new medication since they will not have international service.  To Pam, RN.

## 2016-11-03 NOTE — Patient Instructions (Addendum)
Medication Instructions:  Your physician has recommended you make the following change in your medication:  1-Xarelto 20 mg by mouth daily with food.  Labwork: Your physician recommends that you have lab work today-CMET, CBC, PT/INR, PTT   Testing/Procedures: Your physician has requested that you have en exercise stress myoview in 2 weeks. For further information please visit [email or web address]. Please follow instruction sheet, as given.  Your physician has requested that you have an echocardiogram in 2 weeks. Echocardiography is a painless test that uses sound waves to create images of your heart. It provides your doctor with information about the size and shape of your heart and how well your heart's chambers and valves are working. This procedure takes approximately one hour. There are no restrictions for this procedure.  Follow-Up: Your physician wants you to follow-up in: 4 weeks with Dr. [name].   If you need a refill on your cardiac medications before your next appointment, please call your pharmacy.

## 2016-11-06 ENCOUNTER — Encounter: Payer: Self-pay | Admitting: Cardiovascular Disease

## 2016-11-06 NOTE — Telephone Encounter (Signed)
**Note De-Identified Mcdaniel Ohms Obfuscation** I have sent the pt a [facility] message as requested. Per Dr [name]:  Labs fine ok to start xarelto for afib.

## 2016-11-06 NOTE — Telephone Encounter (Signed)
Labs fine ok to start xarelto for afib

## 2016-11-06 NOTE — Telephone Encounter (Signed)
Left Dr.[name]'s advice on patient's cell phone personal voice mail.

## 2016-11-14 ENCOUNTER — Telehealth (HOSPITAL_COMMUNITY): Payer: Self-pay | Admitting: *Deleted

## 2016-11-14 NOTE — Telephone Encounter (Signed)
Left message on voicemail per DPR in reference to upcoming appointment scheduled on 11/17/16 with detailed instructions given per Myocardial Perfusion Study Information Sheet for the test. LM to arrive 15 minutes early, and that it is imperative to arrive on time for appointment to keep from having the test rescheduled. If you need to cancel or reschedule your appointment, please call the office within 24 hours of your appointment. Failure to do so may result in a cancellation of your appointment, and a $50 no show fee. Phone number given for call back for any questions.  Kirstie Peri

## 2016-11-17 ENCOUNTER — Other Ambulatory Visit: Payer: Self-pay

## 2016-11-17 ENCOUNTER — Ambulatory Visit (HOSPITAL_COMMUNITY): Payer: 59 | Attending: Cardiovascular Disease

## 2016-11-17 ENCOUNTER — Ambulatory Visit (HOSPITAL_BASED_OUTPATIENT_CLINIC_OR_DEPARTMENT_OTHER): Payer: 59

## 2016-11-17 DIAGNOSIS — I4891 Unspecified atrial fibrillation: Secondary | ICD-10-CM | POA: Diagnosis not present

## 2016-11-17 DIAGNOSIS — Z951 Presence of aortocoronary bypass graft: Secondary | ICD-10-CM | POA: Diagnosis not present

## 2016-11-17 DIAGNOSIS — Z8249 Family history of ischemic heart disease and other diseases of the circulatory system: Secondary | ICD-10-CM | POA: Insufficient documentation

## 2016-11-17 DIAGNOSIS — I2510 Atherosclerotic heart disease of native coronary artery without angina pectoris: Secondary | ICD-10-CM | POA: Diagnosis not present

## 2016-11-17 DIAGNOSIS — E785 Hyperlipidemia, unspecified: Secondary | ICD-10-CM

## 2016-11-17 DIAGNOSIS — Z79899 Other long term (current) drug therapy: Secondary | ICD-10-CM | POA: Diagnosis not present

## 2016-11-17 DIAGNOSIS — I071 Rheumatic tricuspid insufficiency: Secondary | ICD-10-CM | POA: Diagnosis not present

## 2016-11-17 DIAGNOSIS — I25810 Atherosclerosis of coronary artery bypass graft(s) without angina pectoris: Secondary | ICD-10-CM | POA: Diagnosis not present

## 2016-11-17 LAB — MYOCARDIAL PERFUSION IMAGING
CHL CUP NUCLEAR SRS: 1
CHL CUP NUCLEAR SSS: 1
NUC STRESS TID: 0.95
Peak HR: 142 {beats}/min
RATE: 0.26
Rest HR: 81 {beats}/min
SDS: 0

## 2016-11-17 MED ORDER — TECHNETIUM TC 99M TETROFOSMIN IV KIT
32.6000 | PACK | Freq: Once | INTRAVENOUS | Status: AC | PRN
  Administered 2016-11-17: 32.6 via INTRAVENOUS
  Filled 2016-11-17: qty 33

## 2016-11-17 MED ORDER — REGADENOSON 0.4 MG/5ML IV SOLN
0.4000 mg | Freq: Once | INTRAVENOUS | Status: AC
  Administered 2016-11-17: 0.4 mg via INTRAVENOUS

## 2016-11-17 MED ORDER — TECHNETIUM TC 99M TETROFOSMIN IV KIT
10.9000 | PACK | Freq: Once | INTRAVENOUS | Status: AC | PRN
  Administered 2016-11-17: 10.9 via INTRAVENOUS
  Filled 2016-11-17: qty 11

## 2016-11-20 ENCOUNTER — Telehealth: Payer: Self-pay | Admitting: Cardiovascular Disease

## 2016-11-20 NOTE — Telephone Encounter (Signed)
Patient aware of test results.

## 2016-11-20 NOTE — Telephone Encounter (Signed)
New message   Pt wants results to the 4/6 test

## 2016-11-27 ENCOUNTER — Telehealth: Payer: Self-pay | Admitting: Cardiovascular Disease

## 2016-11-27 ENCOUNTER — Ambulatory Visit (INDEPENDENT_AMBULATORY_CARE_PROVIDER_SITE_OTHER): Payer: 59 | Admitting: Cardiovascular Disease

## 2016-11-27 ENCOUNTER — Encounter: Payer: Self-pay | Admitting: Cardiovascular Disease

## 2016-11-27 VITALS — BP 94/70 | HR 66 | Ht 69.0 in | Wt 172.8 lb

## 2016-11-27 DIAGNOSIS — Z01812 Encounter for preprocedural laboratory examination: Secondary | ICD-10-CM | POA: Diagnosis not present

## 2016-11-27 DIAGNOSIS — I25810 Atherosclerosis of coronary artery bypass graft(s) without angina pectoris: Secondary | ICD-10-CM | POA: Diagnosis not present

## 2016-11-27 NOTE — Progress Notes (Signed)
Patient ID: Travis Cook, male   DOB: 1960-12-09, 56 y.o.   MRN: [id number]   [name] is seen today post CABG by [name]. He is doing well. His BP and HR always run low and beta blocker stopped post op  . He has been taking an aspirin.  CABG: 06/2009 [name] [name] to [name], [name] OM and [name] to [name]  7/15 had left inguinal surgery repair with [name] with no complications   [name] for primary care now  [name] follows labs  Lab Results  Component Value Date   [facility] 08/15/2013   Daughter unfortunately had first child die at one month with Trisomy 18   In office 3/23/18in afib. He has had some unsettled feelings last 2 weeks and only in retrospect Does he think this has involved irregular heart rate. No TIA, palpitations, syncope. Stamina level About same  Long discussion about afib , rate control , anticoagulation and conversion.  This patients CHA2DS2-VASc Score and unadjusted Ischemic Stroke Rate (% per year) is equal to 2.2 % stroke rate/year from a score of 2  Above score calculated as 1 point each if present [CHF, HTN, DM, Vascular=MI/PAD/Aortic Plaque, Age if 65-74, or Male] Above score calculated as 2 points each if present [Age > 75, or Stroke/TIA/TE]  Myovue reviewed 11/17/16 normal not gated due to afib  Echo 11/17/16  EF 60-65%  MV repair intact no MR LA normal size  Long discussion with patient and wife about [facility].  Procedure including anesthesia with propofol risk of stroke bradycardia, pacer and intubation discussed Benefits in regard to decreased risk stroke, CHF, cardiomegaly discussed Patient understands And is willing to proceed Has not missed any dose of NOAC  ROS: Denies fever, malais, weight loss, blurry vision, decreased visual acuity, cough, sputum, SOB, hemoptysis, pleuritic pain, palpitaitons, heartburn, abdominal pain, melena, lower extremity edema, claudication, or rash.  All other systems reviewed and negative  General: Affect  appropriate Healthy:  appears stated age 41: normal Neck supple with no adenopathy JVP normal no bruits no thyromegaly Lungs clear with no wheezing and good diaphragmatic motion Heart:  S1/S2 no murmur, no rub, gallop or click PMI normal Abdomen: benighn, BS positve, no tenderness, no AAA left inguinal hernia scar  no bruit.  No HSM or HJR Distal pulses intact with no bruits No edema Neuro non-focal Skin warm and dry No muscular weakness   Current Outpatient Prescriptions  Medication Sig Dispense Refill  . aspirin EC 81 MG tablet Take 81 mg by mouth daily.    [name] atorvastatin (LIPITOR) 10 MG tablet Take 10 mg by mouth daily.    . Multiple Vitamins-Minerals (MULTIVITAMIN WITH MINERALS) tablet Take 1 tablet by mouth daily.    . nitroGLYCERIN (NITROSTAT) 0.4 MG SL tablet Place 1 tablet (0.4 mg total) under the tongue every 5 (five) minutes as needed for chest pain. 25 tablet 3  . rivaroxaban (XARELTO) 20 MG TABS tablet Take 1 tablet (20 mg total) by mouth daily with supper. 90 tablet 3  . zolpidem (AMBIEN) 10 MG tablet TAKE 1 TABLET BY MOUTH AT BEDTIME AS NEEDED FOR SLEEP 15 tablet 3   Current Facility-Administered Medications  Medication Dose Route Frequency Provider Last Rate Last Dose  . 0.9 %  sodium chloride infusion  500 mL Intravenous Continuous Milus Banister, MD        Allergies  Patient has no known allergies.  Electrocardiogram:  08/20/15 SR rate 60 LAE nonspecific ST T wave changes  11/03/16 Afib rate 86 PVC afib Is new compared to previous  afib rate 82   Assessment and Plan CAD/CABG:  2010  Stable no angina continue ASA/Statin  Baseline HR low so no beta blocker. Myovue 11/17/16 normal  Chol:  Will call [place] office for most recent labs continue statin   Anxiety/Depression:  Still seems to have issues with stress  F/u primary consider SSRI  Afib:  persistent been on xarelto over a month willing to proceed with [facility] Called endo lab and scheduled for  12/12/16. Orders written Labs including BMET CBC PLT Will be done today.   Spent over 60 minutes with patient and wife more than 50% direct patient care and counseling     [name]

## 2016-11-27 NOTE — Telephone Encounter (Signed)
Patient needed to reschedule for today since he is going out of town at the end of the week.

## 2016-11-27 NOTE — Patient Instructions (Addendum)
Medication Instructions:  Your physician recommends that you continue on your current medications as directed. Please refer to the Current Medication list given to you today.  Labwork: Your physician recommends that you have lab work today. BMET, CBC, and PT/INR  Testing/Procedures: Your physician has recommended that you have a Cardioversion (DCCV). Electrical Cardioversion uses a jolt of electricity to your heart either through paddles or wired patches attached to your chest. This is a controlled, usually prescheduled, procedure. Defibrillation is done under light anesthesia in the hospital, and you usually go home the day of the procedure. This is done to get your heart back into a normal rhythm. You are not awake for the procedure. Please see the instruction sheet given to you today.  Follow-Up: Your physician wants you to follow-up in: 6 months with Dr. [name]. You will receive a reminder letter in the mail two months in advance. If you don't receive a letter, please call our office to schedule the follow-up appointment.   If you need a refill on your cardiac medications before your next appointment, please call your pharmacy.

## 2016-11-27 NOTE — Telephone Encounter (Signed)
New message   Per wife received call Friday to reschedule appt for today. Rescheduled for today @3 :20pm

## 2016-11-28 LAB — CBC WITH DIFFERENTIAL/PLATELET
Basophils Absolute: 0 10*3/uL (ref 0.0–0.2)
Basos: 1 %
EOS (ABSOLUTE): 0.1 10*3/uL (ref 0.0–0.4)
Eos: 1 %
HEMOGLOBIN: 15.2 g/dL (ref 13.0–17.7)
Hematocrit: 44.6 % (ref 37.5–51.0)
IMMATURE GRANS (ABS): 0 10*3/uL (ref 0.0–0.1)
Immature Granulocytes: 0 %
LYMPHS: 23 %
Lymphocytes Absolute: 1.3 10*3/uL (ref 0.7–3.1)
MCH: 30.4 pg (ref 26.6–33.0)
MCHC: 34.1 g/dL (ref 31.5–35.7)
MCV: 89 fL (ref 79–97)
MONOCYTES: 12 %
Monocytes Absolute: 0.7 10*3/uL (ref 0.1–0.9)
NEUTROS ABS: 3.7 10*3/uL (ref 1.4–7.0)
NEUTROS PCT: 63 %
Platelets: 187 10*3/uL (ref 150–379)
RBC: 5 x10E6/uL (ref 4.14–5.80)
RDW: 14.2 % (ref 12.3–15.4)
WBC: 5.8 10*3/uL (ref 3.4–10.8)

## 2016-11-28 LAB — BASIC METABOLIC PANEL
BUN / CREAT RATIO: 14 (ref 9–20)
BUN: 13 mg/dL (ref 6–24)
CO2: 25 mmol/L (ref 18–29)
CREATININE: 0.91 mg/dL (ref 0.76–1.27)
Calcium: 9.6 mg/dL (ref 8.7–10.2)
Chloride: 103 mmol/L (ref 96–106)
GFR, EST AFRICAN AMERICAN: 108 mL/min/{1.73_m2} (ref >59)
GFR, EST NON AFRICAN AMERICAN: 93 mL/min/{1.73_m2} (ref >59)
Glucose: 83 mg/dL (ref 65–99)
Potassium: 4.3 mmol/L (ref 3.5–5.2)
SODIUM: 141 mmol/L (ref 134–144)

## 2016-11-28 LAB — PROTIME-INR
INR: 1.2 (ref 0.8–1.2)
Prothrombin Time: 12.8 s — ABNORMAL HIGH (ref 9.1–12.0)

## 2016-12-01 ENCOUNTER — Ambulatory Visit: Payer: 59 | Admitting: Cardiovascular Disease

## 2016-12-01 ENCOUNTER — Other Ambulatory Visit: Payer: Self-pay | Admitting: Cardiovascular Disease

## 2016-12-01 DIAGNOSIS — I480 Paroxysmal atrial fibrillation: Secondary | ICD-10-CM

## 2016-12-11 ENCOUNTER — Ambulatory Visit: Payer: 59 | Admitting: Cardiovascular Disease

## 2016-12-12 ENCOUNTER — Ambulatory Visit (HOSPITAL_COMMUNITY): Payer: 59 | Admitting: Anesthesiology

## 2016-12-12 ENCOUNTER — Encounter (HOSPITAL_COMMUNITY): Payer: Self-pay

## 2016-12-12 ENCOUNTER — Encounter (HOSPITAL_COMMUNITY)
Admission: RE | Disposition: A | Discharge status: Home / Self Care | Payer: Self-pay | Source: Ambulatory Visit | Attending: Cardiovascular Disease

## 2016-12-12 ENCOUNTER — Ambulatory Visit (HOSPITAL_COMMUNITY)
Admission: RE | Admit: 2016-12-12 | Discharge: 2016-12-12 | Disposition: A | Discharge status: Home / Self Care | Payer: 59 | Source: Ambulatory Visit | Attending: Cardiovascular Disease | Admitting: Cardiovascular Disease

## 2016-12-12 DIAGNOSIS — E785 Hyperlipidemia, unspecified: Secondary | ICD-10-CM | POA: Diagnosis not present

## 2016-12-12 DIAGNOSIS — Z7901 Long term (current) use of anticoagulants: Secondary | ICD-10-CM | POA: Insufficient documentation

## 2016-12-12 DIAGNOSIS — F418 Other specified anxiety disorders: Secondary | ICD-10-CM | POA: Diagnosis not present

## 2016-12-12 DIAGNOSIS — I2510 Atherosclerotic heart disease of native coronary artery without angina pectoris: Secondary | ICD-10-CM | POA: Diagnosis not present

## 2016-12-12 DIAGNOSIS — I4891 Unspecified atrial fibrillation: Secondary | ICD-10-CM

## 2016-12-12 DIAGNOSIS — Z951 Presence of aortocoronary bypass graft: Secondary | ICD-10-CM | POA: Insufficient documentation

## 2016-12-12 DIAGNOSIS — Z7982 Long term (current) use of aspirin: Secondary | ICD-10-CM | POA: Diagnosis not present

## 2016-12-12 DIAGNOSIS — I481 Persistent atrial fibrillation: Secondary | ICD-10-CM | POA: Diagnosis not present

## 2016-12-12 HISTORY — PX: CARDIOVERSION: SHX1299

## 2016-12-12 LAB — CBC
HCT: 40.4 % (ref 39.0–52.0)
Hemoglobin: 13.9 g/dL (ref 13.0–17.0)
MCH: 31 pg (ref 26.0–34.0)
MCHC: 34.4 g/dL (ref 30.0–36.0)
MCV: 90 fL (ref 78.0–100.0)
PLATELETS: 168 10*3/uL (ref 150–400)
RBC: 4.49 MIL/uL (ref 4.22–5.81)
RDW: 13.3 % (ref 11.5–15.5)
WBC: 3.6 10*3/uL — ABNORMAL LOW (ref 4.0–10.5)

## 2016-12-12 LAB — POCT I-STAT 4, (NA,K, GLUC, HGB,HCT)
GLUCOSE: 85 mg/dL (ref 65–99)
HEMATOCRIT: 38 % — AB (ref 39.0–52.0)
Hemoglobin: 12.9 g/dL — ABNORMAL LOW (ref 13.0–17.0)
Potassium: 4.4 mmol/L (ref 3.5–5.1)
Sodium: 142 mmol/L (ref 135–145)

## 2016-12-12 SURGERY — CARDIOVERSION
Anesthesia: General

## 2016-12-12 MED ORDER — LIDOCAINE 2% (20 MG/ML) 5 ML SYRINGE
INTRAMUSCULAR | Status: DC | PRN
  Administered 2016-12-12: 100 mg via INTRAVENOUS

## 2016-12-12 MED ORDER — PROPOFOL 10 MG/ML IV BOLUS
INTRAVENOUS | Status: DC | PRN
  Administered 2016-12-12: 100 mg via INTRAVENOUS

## 2016-12-12 MED ORDER — SODIUM CHLORIDE 0.9 % IV SOLN
INTRAVENOUS | Status: DC
  Administered 2016-12-12: 12:00:00 via INTRAVENOUS

## 2016-12-12 MED ORDER — SODIUM CHLORIDE 0.9 % IV SOLN
INTRAVENOUS | Status: DC | PRN
  Administered 2016-12-12: 13:00:00 via INTRAVENOUS

## 2016-12-12 NOTE — H&P (View-Only) (Signed)
Patient ID: Travis Cook, male   DOB: [birth year], 56 y.o.   MRN: [id number]   Travis Cook is seen today post CABG by [name]. He is doing well. His BP and HR always run low and beta blocker stopped post op  . He has been taking an aspirin.  CABG: 06/2009 [name] [name] to [name], [name] OM and [name] to [name]  7/15 had left inguinal surgery repair with [name] with no complications   [name] for primary care now  [name] follows labs  Lab Results  Component Value Date   [facility] 08/15/2013   Daughter unfortunately had first child die at one month with Trisomy 18   In office 3/23/18in afib. He has had some unsettled feelings last 2 weeks and only in retrospect Does he think this has involved irregular heart rate. No TIA, palpitations, syncope. Stamina level About same  Long discussion about afib , rate control , anticoagulation and conversion.  This patients CHA2DS2-VASc Score and unadjusted Ischemic Stroke Rate (% per year) is equal to 2.2 % stroke rate/year from a score of 2  Above score calculated as 1 point each if present [CHF, HTN, DM, Vascular=MI/PAD/Aortic Plaque, Age if 65-74, or Male] Above score calculated as 2 points each if present [Age > 75, or Stroke/TIA/TE]  Myovue reviewed 11/17/16 normal not gated due to afib  Echo 11/17/16  EF 60-65%  MV repair intact no MR LA normal size  Long discussion with patient and wife about [facility].  Procedure including anesthesia with propofol risk of stroke bradycardia, pacer and intubation discussed Benefits in regard to decreased risk stroke, CHF, cardiomegaly discussed Patient understands And is willing to proceed Has not missed any dose of NOAC  ROS: Denies fever, malais, weight loss, blurry vision, decreased visual acuity, cough, sputum, SOB, hemoptysis, pleuritic pain, palpitaitons, heartburn, abdominal pain, melena, lower extremity edema, claudication, or rash.  All other systems reviewed and negative  General: Affect  appropriate Healthy:  appears stated age 81: normal Neck supple with no adenopathy JVP normal no bruits no thyromegaly Lungs clear with no wheezing and good diaphragmatic motion Heart:  S1/S2 no murmur, no rub, gallop or click PMI normal Abdomen: benighn, BS positve, no tenderness, no AAA left inguinal hernia scar  no bruit.  No HSM or HJR Distal pulses intact with no bruits No edema Neuro non-focal Skin warm and dry No muscular weakness   Current Outpatient Prescriptions  Medication Sig Dispense Refill  . aspirin EC 81 MG tablet Take 81 mg by mouth daily.    [name] atorvastatin (LIPITOR) 10 MG tablet Take 10 mg by mouth daily.    . Multiple Vitamins-Minerals (MULTIVITAMIN WITH MINERALS) tablet Take 1 tablet by mouth daily.    . nitroGLYCERIN (NITROSTAT) 0.4 MG SL tablet Place 1 tablet (0.4 mg total) under the tongue every 5 (five) minutes as needed for chest pain. 25 tablet 3  . rivaroxaban (XARELTO) 20 MG TABS tablet Take 1 tablet (20 mg total) by mouth daily with supper. 90 tablet 3  . zolpidem (AMBIEN) 10 MG tablet TAKE 1 TABLET BY MOUTH AT BEDTIME AS NEEDED FOR SLEEP 15 tablet 3   Current Facility-Administered Medications  Medication Dose Route Frequency Provider Last Rate Last Dose  . 0.9 %  sodium chloride infusion  500 mL Intravenous Continuous Milus Banister, MD        Allergies  Patient has no known allergies.  Electrocardiogram:  08/20/15 SR rate 60 LAE nonspecific ST T wave changes  11/03/16 Afib rate 86 PVC afib Is new compared to previous  afib rate 82   Assessment and Plan CAD/CABG:  2010  Stable no angina continue ASA/Statin  Baseline HR low so no beta blocker. Myovue 11/17/16 normal  Chol:  Will call [place] office for most recent labs continue statin   Anxiety/Depression:  Still seems to have issues with stress  F/u primary consider SSRI  Afib:  persistent been on xarelto over a month willing to proceed with [facility] Called endo lab and scheduled for  12/12/16. Orders written Labs including BMET CBC PLT Will be done today.   Spent over 60 minutes with patient and wife more than 50% direct patient care and counseling     [name]

## 2016-12-12 NOTE — Transfer of Care (Signed)
Immediate Anesthesia Transfer of Care Note  Patient: Travis Cook  Procedure(s) Performed: Procedure(s): CARDIOVERSION (N/A)  Patient Location: Endoscopy Unit  Anesthesia Type:General  Level of Consciousness: awake, alert  and oriented  Airway & Oxygen Therapy: Patient Spontanous Breathing and Patient connected to nasal cannula oxygen  Post-op Assessment: Report given to RN and Post -op Vital signs reviewed and stable  Post vital signs: Reviewed and stable  Last Vitals:  Vitals:   12/12/16 1123  BP: 102/77  Pulse: 61  Resp: 18  Temp: 36.6 C    Last Pain:  Vitals:   12/12/16 1123  TempSrc: Oral         Complications: No apparent anesthesia complications

## 2016-12-12 NOTE — Anesthesia Preprocedure Evaluation (Signed)
Anesthesia Evaluation  Patient identified by MRN, date of birth, ID band Patient awake    Reviewed: Allergy & Precautions, H&P , NPO status , Patient's Chart, lab work & pertinent test results  History of Anesthesia Complications Negative for: history of anesthetic complications  Airway Mallampati: II  TM Distance: >3 FB Neck ROM: Full    Dental  (+) Teeth Intact, Dental Advisory Given   Pulmonary neg pulmonary ROS,    breath sounds clear to auscultation       Cardiovascular + CAD and + CABG   Rhythm:Regular Rate:Normal     Neuro/Psych negative neurological ROS     GI/Hepatic Neg liver ROS, IBS   Endo/Other  negative endocrine ROS  Renal/GU negative Renal ROS     Musculoskeletal   Abdominal   Peds  Hematology negative hematology ROS (+)   Anesthesia Other Findings   Reproductive/Obstetrics negative OB ROS                             Anesthesia Physical  Anesthesia Plan  ASA: III  Anesthesia Plan: General   Post-op Pain Management:    Induction: Intravenous  Airway Management Planned: Mask  Additional Equipment:   Intra-op Plan:   Post-operative Plan:   Informed Consent: I have reviewed the patients History and Physical, chart, labs and discussed the procedure including the risks, benefits and alternatives for the proposed anesthesia with the patient or authorized representative who has indicated his/her understanding and acceptance.   Dental advisory given  Plan Discussed with: CRNA  Anesthesia Plan Comments:         Anesthesia Quick Evaluation

## 2016-12-12 NOTE — Discharge Instructions (Signed)
Electrical Cardioversion, Care After °This sheet gives you information about how to care for yourself after your procedure. Your health care provider may also give you more specific instructions. If you have problems or questions, contact your health care provider. °What can I expect after the procedure? °After the procedure, it is common to have: °· Some redness on the skin where the shocks were given. °Follow these instructions at home: °· Do not drive for 24 hours if you were given a medicine to help you relax (sedative). °· Take over-the-counter and prescription medicines only as told by your health care provider. °· Ask your health care provider how to check your pulse. Check it often. °· Rest for 48 hours after the procedure or as told by your health care provider. °· Avoid or limit your caffeine use as told by your health care provider. °Contact a health care provider if: °· You feel like your heart is beating too quickly or your pulse is not regular. °· You have a serious muscle cramp that does not go away. °Get help right away if: °· You have discomfort in your chest. °· You are dizzy or you feel faint. °· You have trouble breathing or you are short of breath. °· Your speech is slurred. °· You have trouble moving an arm or leg on one side of your body. °· Your fingers or toes turn cold or blue. °This information is not intended to replace advice given to you by your health care provider. Make sure you discuss any questions you have with your health care provider. °Document Released: 05/21/2013 Document Revised: 03/03/2016 Document Reviewed: 02/04/2016 °Elsevier Interactive Patient Education © 2017 [employer] ° °

## 2016-12-12 NOTE — Anesthesia Preprocedure Evaluation (Signed)
Anesthesia Evaluation  Patient identified by MRN, date of birth, ID band Patient awake    Reviewed: Allergy & Precautions, H&P , NPO status , Patient's Chart, lab work & pertinent test results  History of Anesthesia Complications Negative for: history of anesthetic complications  Airway Mallampati: II  TM Distance: >3 FB Neck ROM: Full    Dental  (+) Teeth Intact, Dental Advisory Given   Pulmonary neg pulmonary ROS,    breath sounds clear to auscultation       Cardiovascular + CAD and + CABG   Rhythm:Regular Rate:Normal  Echo 11/2016 - Left ventricle: The cavity size was normal. Wall thickness was increased in a pattern of mild LVH. Systolic function was normal. The estimated ejection fraction was in the range of 60% to 65%. Wall motion was normal; there were no regional wall motion abnormalities. The study is not technically sufficient to allow evaluation of LV diastolic function. - Mitral valve: Prior mitral valve repair. Mildly thickened   leaflets . No stenosis. There was trivial regurgitation. - Left atrium: The atrium was normal in size. - Tricuspid valve: There was mild regurgitation. - Pulmonary arteries: PA peak pressure: 23 mm Hg (S). - Inferior vena cava: The vessel was normal in size. The   respirophasic diameter changes were in the normal range (= 50%), consistent with normal central venous pressure.  Impressions: - LVEF 60-65%, mild LVH, normal wall motion, stable mitral valve  repair, trivial MR, normal biatrial size, mild TR, RVSP 23 mmHg, normal IVC.   Neuro/Psych negative neurological ROS     GI/Hepatic Neg liver ROS, IBS   Endo/Other  negative endocrine ROS  Renal/GU negative Renal ROS     Musculoskeletal   Abdominal   Peds  Hematology negative hematology ROS (+)   Anesthesia Other Findings   Reproductive/Obstetrics negative OB ROS                              Anesthesia Physical  Anesthesia Plan  ASA: III  Anesthesia Plan: General   Post-op Pain Management:    Induction: Intravenous  Airway Management Planned: Mask  Additional Equipment:   Intra-op Plan:   Post-operative Plan:   Informed Consent: I have reviewed the patients History and Physical, chart, labs and discussed the procedure including the risks, benefits and alternatives for the proposed anesthesia with the patient or authorized representative who has indicated his/her understanding and acceptance.   Dental advisory given  Plan Discussed with: CRNA  Anesthesia Plan Comments:         Anesthesia Quick Evaluation

## 2016-12-12 NOTE — CV Procedure (Signed)
Malcolm: Anesthesia: Propofol/Lidocaine  DCC x 1 120 J converted from afib rate 113 to NSR rate 72 On Rx xarelto no missed doses  No immediate neurologic sequelae  [name]

## 2016-12-12 NOTE — Anesthesia Postprocedure Evaluation (Signed)
Anesthesia Post Note  Patient: Travis Cook  Procedure(s) Performed: Procedure(s) (LRB): CARDIOVERSION (N/A)  Patient location during evaluation: PACU Anesthesia Type: General Level of consciousness: sedated and patient cooperative Pain management: pain level controlled Vital Signs Assessment: post-procedure vital signs reviewed and stable Respiratory status: spontaneous breathing Cardiovascular status: stable Anesthetic complications: no       Last Vitals:  Vitals:   12/12/16 1310 12/12/16 1320  BP: 101/82 118/87  Pulse: 60 (!) 59  Resp: 15 15  Temp:      Last Pain:  Vitals:   12/12/16 1258  TempSrc: Oral                 Nolon Nations

## 2016-12-12 NOTE — Interval H&P Note (Signed)
History and Physical Interval Note:  12/12/2016 10:42 AM  Travis Cook  has presented today for surgery, with the diagnosis of afib  The various methods of treatment have been discussed with the patient and family. After consideration of risks, benefits and other options for treatment, the patient has consented to  Procedure(s): CARDIOVERSION (N/A) as a surgical intervention .  The patient's history has been reviewed, patient examined, no change in status, stable for surgery.  I have reviewed the patient's chart and labs.  Questions were answered to the patient's satisfaction.     [name]

## 2016-12-13 ENCOUNTER — Encounter (HOSPITAL_COMMUNITY): Payer: Self-pay | Admitting: Cardiovascular Disease

## 2016-12-14 ENCOUNTER — Telehealth: Payer: Self-pay | Admitting: Internal Medicine

## 2016-12-14 ENCOUNTER — Telehealth: Payer: Self-pay | Admitting: Cardiovascular Disease

## 2016-12-14 NOTE — Telephone Encounter (Signed)
Pt had Cardioversion 2 days ago, not sure if he is in Afib or not and he is going out of town tomorrow, and wants to know what to look for and if there was anything he can buy to monitor it? Pls advise  [phone]

## 2016-12-14 NOTE — Telephone Encounter (Signed)
Patient's wife (DPR) is not sure if patient is back in A. FIB and is concerned about going out of town. Offered an appointment for tomorrow morning, but patient will be on a plane at that time. Patient's wife requested an appointment with the A. FIB clinic after he gets back. The first available is 12/26/16. Patient's wife agreed to appointment. Patient is not sure if he is back in A. FIB and wanted to know what to look for. Informed patient's wife to feel his pulse, to see if it's regular or irregular beat. Informed patient's wife that the only way to really tell if he is in A. FIB is to have an EKG. Sent MyChart message of appointment time, garage code and A. FIB clinic phone number. Patient's wife verbalized understanding and will call with any other concerns.

## 2016-12-14 NOTE — Telephone Encounter (Signed)
Cardiology after-hours note  Returned call from patient who states that he has noticed some "extra-beats" while taking his pulse recently, and also had a nurse friend of his listen to his heart and note some extra beats every 10-15 beats, but it is otherwise regular.  He is wondering if it is safe to travel tomorrow as he recently had a DCCV for afib.  I informed him that the only way to know for sure if he was out of rhythm would be an EKG, which he was offered by the office staff earlier today.  He voiced understanding and denied CP, SOB, palpitations, LE edema, or orthopnea.  All questions answered.

## 2016-12-15 NOTE — Telephone Encounter (Signed)
Can come in for ECG or see me next available ok to travel

## 2016-12-26 ENCOUNTER — Encounter (HOSPITAL_COMMUNITY): Payer: Self-pay | Admitting: Nurse Practitioner

## 2016-12-26 ENCOUNTER — Ambulatory Visit (HOSPITAL_COMMUNITY)
Admission: RE | Admit: 2016-12-26 | Discharge: 2016-12-26 | Disposition: A | Discharge status: Home / Self Care | Payer: 59 | Source: Ambulatory Visit | Attending: Nurse Practitioner | Admitting: Nurse Practitioner

## 2016-12-26 VITALS — BP 118/72 | HR 66 | Ht 69.0 in | Wt 168.8 lb

## 2016-12-26 DIAGNOSIS — Z7901 Long term (current) use of anticoagulants: Secondary | ICD-10-CM | POA: Diagnosis not present

## 2016-12-26 DIAGNOSIS — Z8249 Family history of ischemic heart disease and other diseases of the circulatory system: Secondary | ICD-10-CM | POA: Insufficient documentation

## 2016-12-26 DIAGNOSIS — I4891 Unspecified atrial fibrillation: Secondary | ICD-10-CM

## 2016-12-26 DIAGNOSIS — Z87442 Personal history of urinary calculi: Secondary | ICD-10-CM | POA: Insufficient documentation

## 2016-12-26 DIAGNOSIS — I480 Paroxysmal atrial fibrillation: Secondary | ICD-10-CM | POA: Diagnosis not present

## 2016-12-26 DIAGNOSIS — E785 Hyperlipidemia, unspecified: Secondary | ICD-10-CM | POA: Insufficient documentation

## 2016-12-26 DIAGNOSIS — I517 Cardiomegaly: Secondary | ICD-10-CM | POA: Diagnosis not present

## 2016-12-26 DIAGNOSIS — F419 Anxiety disorder, unspecified: Secondary | ICD-10-CM | POA: Diagnosis not present

## 2016-12-26 DIAGNOSIS — I2510 Atherosclerotic heart disease of native coronary artery without angina pectoris: Secondary | ICD-10-CM | POA: Insufficient documentation

## 2016-12-26 DIAGNOSIS — Z7982 Long term (current) use of aspirin: Secondary | ICD-10-CM | POA: Insufficient documentation

## 2016-12-26 NOTE — Progress Notes (Signed)
Primary Care Physician: [name], MD Referring Physician:Dr. Samil Mecham is a 56 y.o. male with a h/o CAD s/p CABG in 20 10 in the afib clinic for evaluation. New onset afib diagnosed in April and was successfully cardioverted after sufficient loading of xarelto 20 mg daily. He has not had any return of afib.   He denies snoring. He is of normal weight and walks at a good pace on a regular basis. He does enjoy coffee but not an excessive drinker of same. He does drink 9-11 glasses of wine a week and have discussed lowering intake to some degree. He has tendency to have a slow heart beat at baseline and is not on BB due to this.He has been under a lot of stress recently.  Today, he denies symptoms of palpitations, chest pain, shortness of breath, orthopnea, PND, lower extremity edema, dizziness, presyncope, syncope, or neurologic sequela. The patient is tolerating medications without difficulties and is otherwise without complaint today.   Past Medical History:  Diagnosis Date  . Anxiety   . CAD (coronary artery disease) 2010   CAD    dr [name]  . History of kidney stones    2005  . HYPERLIPIDEMIA 08/29/2007   Qualifier: Diagnosis of  By: Scherrie Gerlach    . NEPHROLITHIASIS, HX OF 08/29/2007   Qualifier: Diagnosis of  By: Scherrie Gerlach     Past Surgical History:  Procedure Laterality Date  . CARDIAC CATHETERIZATION    . CARDIOVERSION N/A 12/12/2016   Procedure: CARDIOVERSION;  Surgeon: [name], MD;  Location: [facility] ENDOSCOPY;  Service: Cardiovascular;  Laterality: N/A;  . COLONOSCOPY    . CORONARY ARTERY BYPASS GRAFT     2010  . heart bypass     3 vessels 2010  . INGUINAL HERNIA REPAIR Left 01/16/2014   Procedure:  LEFT INGUINAL HERNIA REPAIR WITH ON-Q PUMP PLACEMENT;  Surgeon: [name], MD;  Location: [place];  Service: General;  Laterality: Left;  . INSERTION OF MESH Left 01/16/2014   Procedure: INSERTION OF MESH;  Surgeon: [name], MD;   Location: [place];  Service: General;  Laterality: Left;  . lithotripsy 2005    . POLYPECTOMY      Current Outpatient Prescriptions  Medication Sig Dispense Refill  . aspirin EC 81 MG tablet Take 81 mg by mouth daily.    [name] atorvastatin (LIPITOR) 10 MG tablet Take 10 mg by mouth daily.    . Multiple Vitamins-Minerals (MULTIVITAMIN WITH MINERALS) tablet Take 1 tablet by mouth daily.    . nitroGLYCERIN (NITROSTAT) 0.4 MG SL tablet Place 1 tablet (0.4 mg total) under the tongue every 5 (five) minutes as needed for chest pain. 25 tablet 3  . rivaroxaban (XARELTO) 20 MG TABS tablet Take 1 tablet (20 mg total) by mouth daily with supper. 90 tablet 3  . zolpidem (AMBIEN) 10 MG tablet TAKE 1 TABLET BY MOUTH AT BEDTIME AS NEEDED FOR SLEEP 15 tablet 3   Current Facility-Administered Medications  Medication Dose Route Frequency Provider Last Rate Last Dose  . 0.9 %  sodium chloride infusion  500 mL Intravenous Continuous Milus Banister, MD        No Known Allergies  Social History   Social History  . Marital status: Married    Spouse name: N/A  . Number of children: N/A  . Years of education: N/A   Occupational History  . Not on file.   Social History  Main Topics  . Smoking status: Never Smoker  . Smokeless tobacco: Never Used  . Alcohol use 3.0 - 3.6 oz/week    5 - 6 Glasses of wine per week  . Drug use: No  . Sexual activity: Not on file   Other Topics Concern  . Not on file   Social History Narrative  . No narrative on file    Family History  Problem Relation Age of Onset  . Heart disease Father 56       CAD  . Colon cancer Neg Hx   . Colon polyps Neg Hx   . Rectal cancer Neg Hx   . Stomach cancer Neg Hx   . Esophageal cancer Neg Hx     ROS- All systems are reviewed and negative except as per the HPI above  Physical Exam: Vitals:   12/26/16 1525  BP: 118/72  Pulse: 66  Weight: 168 lb 12.8 oz (76.6 kg)  Height: 5\' 9"  (1.753 m)   Wt Readings from Last 3  Encounters:  12/26/16 168 lb 12.8 oz (76.6 kg)  12/12/16 171 lb (77.6 kg)  11/27/16 172 lb 12.8 oz (78.4 kg)    Labs: Lab Results  Component Value Date   NA 142 12/12/2016   K 4.4 12/12/2016   CL 103 11/27/2016   CO2 25 11/27/2016   GLUCOSE 85 12/12/2016   BUN 13 11/27/2016   CREATININE 0.91 11/27/2016   CALCIUM 9.6 11/27/2016   MG 2.2 06/19/2009   Lab Results  Component Value Date   INR 1.2 11/27/2016   Lab Results  Component Value Date   CHOL 168 08/15/2013   HDL 51.30 08/15/2013   LDLCALC 97 08/15/2013   TRIG 101.0 08/15/2013     GEN- The patient is well appearing, alert and oriented x 3 today.   Head- normocephalic, atraumatic Eyes-  Sclera clear, conjunctiva pink Ears- hearing intact Oropharynx- clear Neck- supple, no JVP Lymph- no cervical lymphadenopathy Lungs- Clear to ausculation bilaterally, normal work of breathing Heart- Regular rate and rhythm, no murmurs, rubs or gallops, PMI not laterally displaced GI- soft, NT, ND, + BS Extremities- no clubbing, cyanosis, or edema MS- no significant deformity or atrophy Skin- no rash or lesion Psych- euthymic mood, full affect Neuro- strength and sensation are intact  EKG- NSR at 66 bpm Study Conclusions  - Left ventricle: The cavity size was normal. Wall thickness was   increased in a pattern of mild LVH. Systolic function was normal.   The estimated ejection fraction was in the range of 60% to 65%.   Wall motion was normal; there were no regional wall motion   abnormalities. The study is not technically sufficient to allow   evaluation of LV diastolic function. - Mitral valve: Prior mitral valve repair. Mildly thickened   leaflets . No stenosis. There was trivial regurgitation. - Left atrium: The atrium was normal in size. - Tricuspid valve: There was mild regurgitation. - Pulmonary arteries: PA peak pressure: 23 mm Hg (S). - Inferior vena cava: The vessel was normal in size. The   respirophasic  diameter changes were in the normal range (= 50%),   consistent with normal central venous pressure.  Impressions:  - LVEF 60-65%, mild LVH, normal wall motion, stable mitral valve   repair, trivial MR, normal biatrial size, mild TR, RVSP 23 mmHg,   normal IVC.(PT STATES NO VALVE REPAIR)    Assessment and Plan: 1. Paroxysmal afib, new onset Successful cardioversion General education re afib  Try to cut back on coffee/wine intake By my estimation, he has a chadsvasc score of 1(CAD). But per Dr. Kyla Balzarine note he is a 2. If a 1, he could stop anticoagulation after 30 days post cardioversion, 2 by guidelines indefinite anticoagulation I will send him back to Dr. [name] to discuss need for long term anticoagulation and if so, does he need to continue asa Bleeding precautions discussed 30 mg cardizem given for breakthrough afib, if it should reoccur, as he travels frequently  afib clinic as needed  Butch Penny C. Xcaret Morad, [facility] [address] [place], [place] [address] [phone]

## 2016-12-27 ENCOUNTER — Other Ambulatory Visit (HOSPITAL_COMMUNITY): Payer: Self-pay | Admitting: *Deleted

## 2016-12-27 MED ORDER — DILTIAZEM HCL 30 MG PO TABS: ORAL_TABLET | ORAL | 2 refills | Status: DC

## 2016-12-28 ENCOUNTER — Telehealth: Payer: Self-pay

## 2016-12-28 NOTE — Telephone Encounter (Signed)
Left message for patient to call back  

## 2016-12-28 NOTE — Telephone Encounter (Signed)
-----   Message from Juluis Mire, RN sent at 12/26/2016  3:57 PM EDT ----- Regarding: follow up with nishan Pt needs follow up in 1 month with Dr. [name]. Please call pt to get this set up. [phone]. Thanks [employer]

## 2017-01-03 NOTE — Telephone Encounter (Signed)
Patient has appointment with Dr. [name] in June.

## 2017-01-21 NOTE — Progress Notes (Deleted)
Patient ID: UNNAMED ZEIEN, male   DOB: 1961/01/01, 56 y.o.   MRN: [id number]   Travis Cook is seen today post CABG by [name]. He is doing well. His BP and HR always run low and beta blocker stopped post op  . He has been taking an aspirin.  CABG: 06/2009 [name] [name] to [name], [name] OM and [name] to [name]  7/15 had left inguinal surgery repair with [name] with no complications   Travis Cook for primary care now  [name] follows labs  Lab Results  Component Value Date   [facility] 97 08/15/2013   Daughter unfortunately had first child die at one month with Trisomy 18   In office 3/23/18in afib. He has had some unsettled feelings last 2 weeks and only in retrospect Does he think this has involved irregular heart rate. No TIA, palpitations, syncope. Stamina level About same  Long discussion about afib , rate control , anticoagulation and conversion.  This patients CHA2DS2-VASc Score and unadjusted Ischemic Stroke Rate (% per year) is equal to 0.6 % stroke rate/year from a score of 1  Above score calculated as 1 point each if present [CHF, HTN, DM, Vascular=MI/PAD/Aortic Plaque, Age if 65-74, or Male] Above score calculated as 2 points each if present [Age > 75, or Stroke/TIA/TE]   Myovue reviewed 11/17/16 normal not gated due to afib  Echo 11/17/16  EF 60-65%  MV repair intact no MR LA normal size  Had successful [facility] 12/12/16    ROS: Denies fever, malais, weight loss, blurry vision, decreased visual acuity, cough, sputum, SOB, hemoptysis, pleuritic pain, palpitaitons, heartburn, abdominal pain, melena, lower extremity edema, claudication, or rash.  All other systems reviewed and negative  General: There were no vitals taken for this visit. Affect appropriate Healthy:  appears stated age 67: normal Neck supple with no adenopathy JVP normal no bruits no thyromegaly Lungs clear with no wheezing and good diaphragmatic motion Heart:  S1/S2 no murmur, no rub, gallop or click post  sternotomy PMI normal Abdomen: benighn, BS positve, no tenderness, no AAA no bruit.  No HSM or HJR Distal pulses intact with no bruits No edema Neuro non-focal Skin warm and dry No muscular weakness    Current Outpatient Prescriptions  Medication Sig Dispense Refill  . aspirin EC 81 MG tablet Take 81 mg by mouth daily.    [name] atorvastatin (LIPITOR) 10 MG tablet Take 10 mg by mouth daily.    [name] diltiazem (CARDIZEM) 30 MG tablet Take 1 tablet every 4 hours AS NEEDED for rapid afib heart rate >100 45 tablet 2  . Multiple Vitamins-Minerals (MULTIVITAMIN WITH MINERALS) tablet Take 1 tablet by mouth daily.    . nitroGLYCERIN (NITROSTAT) 0.4 MG SL tablet Place 1 tablet (0.4 mg total) under the tongue every 5 (five) minutes as needed for chest pain. 25 tablet 3  . rivaroxaban (XARELTO) 20 MG TABS tablet Take 1 tablet (20 mg total) by mouth daily with supper. 90 tablet 3  . zolpidem (AMBIEN) 10 MG tablet TAKE 1 TABLET BY MOUTH AT BEDTIME AS NEEDED FOR SLEEP 15 tablet 3   Current Facility-Administered Medications  Medication Dose Route Frequency Provider Last Rate Last Dose  . 0.9 %  sodium chloride infusion  500 mL Intravenous Continuous Milus Banister, MD        Allergies  Patient has no known allergies.  Electrocardiogram:  08/20/15 SR rate 60 LAE nonspecific ST T wave changes  11/03/16 Afib rate 86 PVC afib Is new  compared to previous  afib rate 82   Assessment and Plan CAD/CABG:  2010  Stable no angina continue ASA/Statin  Baseline HR low so no beta blocker. Myovue 11/17/16 normal  Chol:  Will call [place] office for most recent labs continue statin   Anxiety/Depression:  Still seems to have issues with stress  F/u primary consider SSRI  Afib:  CHA2VASC 1 for vascular disease [facility] successful 12/12/16 ***    [name]

## 2017-01-23 ENCOUNTER — Telehealth: Payer: Self-pay | Admitting: Cardiovascular Disease

## 2017-01-23 NOTE — Telephone Encounter (Signed)
New message    Pt wife is calling stating pt is back in afib. She said he had a cardioversion about a month ago.   Patient c/o Palpitations:  High priority if patient c/o lightheadedness and shortness of breath.  1. How long have you been having palpitations? Since last night  2. Are you currently experiencing lightheadedness and shortness of breath? yes  3. Have you checked your BP and heart rate? (document readings)   4. Are you experiencing any other symptoms? Morning fatigue

## 2017-01-23 NOTE — Progress Notes (Signed)
Patient ID: Travis Cook, male   DOB: 1961/03/13, 56 y.o.   MRN: [id number]   Travis Cook is seen today post CABG by [name]. He is doing well. His BP and HR always run low and beta blocker stopped post op  . He has been taking an aspirin.  CABG: 06/2009 [name] [name] to [name], [name] OM and [name] to [name]  7/15 had left inguinal surgery repair with [name] with no complications   [name] for primary care now  [name] follows labs  Lab Results  Component Value Date   [facility] 97 08/15/2013   Daughter unfortunately had first child die at one month with Trisomy 18   In office 3/23/18in afib. He has had some unsettled feelings last 2 weeks and only in retrospect Does he think this has involved irregular heart rate. No TIA, palpitations, syncope. Stamina level About same  Long discussion about afib , rate control , anticoagulation and conversion.  This patients CHA2DS2-VASc Score and unadjusted Ischemic Stroke Rate (% per year) is equal to 0.6 % stroke rate/year from a score of 1  Above score calculated as 1 point each if present [CHF, HTN, DM, Vascular=MI/PAD/Aortic Plaque, Age if 65-74, or Male] Above score calculated as 2 points each if present [Age > 75, or Stroke/TIA/TE]   Myovue reviewed 11/17/16 normal not gated due to afib  Echo 11/17/16  EF 60-65%  MV repair intact no MR LA normal size  Had successful [facility] 12/12/16   June 11th after hectic work week noted rapid pulse on iwatch Back in afib now  Long discussion with him and wife. Options for AAT, repeat [facility] with no drug change or ablation Favor trial of amiodarone. Discussed side effect profile Will take him some time to make Some work changes to decrease stress has been taking cardizem q 4 hours and need to cut this back  ROS: Denies fever, malais, weight loss, blurry vision, decreased visual acuity, cough, sputum, SOB, hemoptysis, pleuritic pain, palpitaitons, heartburn, abdominal pain, melena, lower extremity edema,  claudication, or rash.  All other systems reviewed and negative  General: BP 92/60   Pulse 67   Ht 5\' 9"  (1.753 m)   Wt 75.8 kg (167 lb)   SpO2 98%   BMI 24.66 kg/m  Affect appropriate Healthy:  appears stated age 43: normal Neck supple with no adenopathy JVP normal no bruits no thyromegaly Lungs clear with no wheezing and good diaphragmatic motion Heart:  S1/S2 no murmur, no rub, gallop or click post sternotomy PMI normal Abdomen: benighn, BS positve, no tenderness, no AAA no bruit.  No HSM or HJR Distal pulses intact with no bruits No edema Neuro non-focal Skin warm and dry No muscular weakness    Current Outpatient Prescriptions  Medication Sig Dispense Refill  . aspirin EC 81 MG tablet Take 81 mg by mouth daily.    [name] atorvastatin (LIPITOR) 10 MG tablet Take 10 mg by mouth daily.    [name] diltiazem (CARDIZEM) 30 MG tablet Take 1 tablet every 4 hours AS NEEDED for rapid afib heart rate >100 45 tablet 2  . Multiple Vitamins-Minerals (MULTIVITAMIN WITH MINERALS) tablet Take 1 tablet by mouth daily.    . nitroGLYCERIN (NITROSTAT) 0.4 MG SL tablet Place 1 tablet (0.4 mg total) under the tongue every 5 (five) minutes as needed for chest pain. 25 tablet 3  . rivaroxaban (XARELTO) 20 MG TABS tablet Take 1 tablet (20 mg total) by mouth daily with supper. 90 tablet 3  .  zolpidem (AMBIEN) 10 MG tablet TAKE 1 TABLET BY MOUTH AT BEDTIME AS NEEDED FOR SLEEP 15 tablet 3  . amiodarone (PACERONE) 200 MG tablet Take 400 mg (2 tablets) by mouth twice daily for 2 weeks, then take 200 mg by mouth twice daily 84 tablet 3   Current Facility-Administered Medications  Medication Dose Route Frequency Provider Last Rate Last Dose  . 0.9 %  sodium chloride infusion  500 mL Intravenous Continuous Milus Banister, MD        Allergies  Patient has no known allergies.  Electrocardiogram:  08/20/15 SR rate 60 LAE nonspecific ST T wave changes  11/03/16 Afib rate 86 PVC afib Is new compared to  previous  afib rate 82  01/24/17  afib rate 72 nonspecific ST chagne s  Assessment and Plan CAD/CABG:  2010  Stable no angina continue ASA/Statin  Baseline HR low so no beta blocker. Myovue 11/17/16 normal  Chol:  Will call [place] office for most recent labs continue statin   Anxiety/Depression:  Still seems to have issues with stress  F/u primary consider SSRI  Afib:  CHA2VASC 1 for vascular disease [place] successful 12/12/16 Reverted back to afib last 2 weeks. Start amiodarone 400 bid for 2 weeks then 200 bid f/u with me 8 weeks and consider Repeat DCC if not converted with drug. Did mention that if he does not convert or quickly reverts back I would have him See EP to discuss ablation. He will continue his DOAC. Will get labs for liver and thyroid today No chronic lung disease Or COPD plan DLCO PFT;s in 6 months   Indicated to watch for sun sensitivity and nausea with amiodarone. Prefer to load for 2 weeks at higher dose  Total time spent with patient including face to face and direct patient care 55 minutes     [name]

## 2017-01-23 NOTE — Telephone Encounter (Signed)
F/u Message  Pt wife would like to speak back with RN. Please call back to discuss. She asked for [facility]

## 2017-01-23 NOTE — Telephone Encounter (Signed)
Spoke with pt, sometime yesterday he went back into atrial fib. He took diltiazem at 10 pm and 4 am. His heart rate currently is running 78-110 bpm. He reports feeling fatigue but otherwise okay. He was instructed to take another diltiazem this morning. He will see dr [name] tomorrow and will call prior to appointment with problems. Pt agreed with this plan.

## 2017-01-23 NOTE — Telephone Encounter (Signed)
Spoke with pt wife, they were asking if he was seen and needed another cardioversion when would that be able to be done. Explained we have someone in the hospital everyday during the week to do these procedures. If they only want nishan to do it would be 02-07-17 before he is reader b but that could change if he is willing to do it on another day. They want to do the DCCV 02-07-17 if do able. I explained this far out will probably be fine for scheduling. They will discuss at follow up tomorrow.

## 2017-01-24 ENCOUNTER — Ambulatory Visit: Payer: 59 | Admitting: Cardiovascular Disease

## 2017-01-24 ENCOUNTER — Ambulatory Visit (INDEPENDENT_AMBULATORY_CARE_PROVIDER_SITE_OTHER): Payer: 59 | Admitting: Cardiovascular Disease

## 2017-01-24 ENCOUNTER — Encounter: Payer: Self-pay | Admitting: Cardiovascular Disease

## 2017-01-24 VITALS — BP 92/60 | HR 67 | Ht 69.0 in | Wt 167.0 lb

## 2017-01-24 DIAGNOSIS — Z79899 Other long term (current) drug therapy: Secondary | ICD-10-CM

## 2017-01-24 DIAGNOSIS — I4891 Unspecified atrial fibrillation: Secondary | ICD-10-CM | POA: Diagnosis not present

## 2017-01-24 MED ORDER — AMIODARONE HCL 200 MG PO TABS: ORAL_TABLET | ORAL | 3 refills | Status: DC

## 2017-01-24 NOTE — Patient Instructions (Addendum)
Medication Instructions:  Your physician has recommended you make the following change in your medication:  1-START Amiodarone 400 mg by mouth twice a day for two weeks, then take 200 mg by mouth twice daily.   Labwork: Your physician recommends that you have lab work today-CMET and TSH  Testing/Procedures: NONE  Follow-Up: Your physician wants you to follow-up in: 8 weeks with Dr. [name].   Your physician recommends that you schedule a follow-up appointment in: 3 months with Dr. Curt Bears for possible ablation.     If you need a refill on your cardiac medications before your next appointment, please call your pharmacy.

## 2017-01-25 LAB — COMPREHENSIVE METABOLIC PANEL
ALBUMIN: 4.6 g/dL (ref 3.5–5.5)
ALT: 16 IU/L (ref 0–44)
AST: 17 IU/L (ref 0–40)
Albumin/Globulin Ratio: 2.6 — ABNORMAL HIGH (ref 1.2–2.2)
Alkaline Phosphatase: 60 IU/L (ref 39–117)
BUN / CREAT RATIO: 16 (ref 9–20)
BUN: 12 mg/dL (ref 6–24)
Bilirubin Total: 0.6 mg/dL (ref 0.0–1.2)
CALCIUM: 9.3 mg/dL (ref 8.7–10.2)
CO2: 21 mmol/L (ref 20–29)
CREATININE: 0.74 mg/dL — AB (ref 0.76–1.27)
Chloride: 105 mmol/L (ref 96–106)
GFR calc non Af Amer: 102 mL/min/{1.73_m2} (ref >59)
GFR, EST AFRICAN AMERICAN: 118 mL/min/{1.73_m2} (ref >59)
GLUCOSE: 89 mg/dL (ref 65–99)
Globulin, Total: 1.8 g/dL (ref 1.5–4.5)
Potassium: 4.7 mmol/L (ref 3.5–5.2)
Sodium: 142 mmol/L (ref 134–144)
TOTAL PROTEIN: 6.4 g/dL (ref 6.0–8.5)

## 2017-01-25 LAB — TSH: TSH: 1.5 u[IU]/mL (ref 0.450–4.500)

## 2017-01-29 ENCOUNTER — Encounter: Payer: Self-pay | Admitting: Cardiovascular Disease

## 2017-03-12 DIAGNOSIS — D225 Melanocytic nevi of trunk: Secondary | ICD-10-CM | POA: Diagnosis not present

## 2017-03-12 DIAGNOSIS — L565 Disseminated superficial actinic porokeratosis (DSAP): Secondary | ICD-10-CM | POA: Diagnosis not present

## 2017-03-12 DIAGNOSIS — L821 Other seborrheic keratosis: Secondary | ICD-10-CM | POA: Diagnosis not present

## 2017-03-15 ENCOUNTER — Encounter: Payer: Self-pay | Admitting: Cardiovascular Disease

## 2017-03-16 ENCOUNTER — Ambulatory Visit: Payer: 59 | Admitting: Cardiovascular Disease

## 2017-03-17 ENCOUNTER — Telehealth: Payer: Self-pay | Admitting: Internal Medicine

## 2017-03-17 NOTE — Telephone Encounter (Signed)
Patient c/o severe headache for 3 d. Recently started on xarelto. Was told by Dr. [name] about possibility of subdural hematoma. Told pt to have someone drive him to ED and get evaluated by CT scan for subdural hematoma. Pt showed understanding and will be going to the ED.

## 2017-03-26 DIAGNOSIS — Z125 Encounter for screening for malignant neoplasm of prostate: Secondary | ICD-10-CM | POA: Diagnosis not present

## 2017-03-26 DIAGNOSIS — E784 Other hyperlipidemia: Secondary | ICD-10-CM | POA: Diagnosis not present

## 2017-03-26 DIAGNOSIS — Z0000 Encounter for general adult medical examination without abnormal findings: Secondary | ICD-10-CM | POA: Diagnosis not present

## 2017-03-27 NOTE — Progress Notes (Signed)
Patient ID: Travis Cook, male   DOB: Sep 21, 1960, 56 y.o.   MRN: [id number]   Travis Cook is seen today post CABG by [name]. He is doing well. His BP and HR always run low and beta blocker stopped post op  . He has been taking an aspirin.  CABG: 06/2009 [name] [name] to [name], [name] OM and [name] to [name]  7/15 had left inguinal surgery repair with [name] with no complications   Travis Cook for primary care now  [name] follows labs  Lab Results  Component Value Date   [facility] 97 08/15/2013   Daughter unfortunately had first child die at one month with Trisomy 18   In office 11/03/16   in afib. He has had some unsettled feelings and only in retrospect Does he think this has involved irregular heart rate. No TIA, palpitations, syncope. Stamina level About same  Long discussion about afib , rate control , anticoagulation and conversion.  This patients CHA2DS2-VASc Score and unadjusted Ischemic Stroke Rate (% per year) is equal to 0.6 % stroke rate/year from a score of 1  Above score calculated as 1 point each if present [CHF, HTN, DM, Vascular=MI/PAD/Aortic Plaque, Age if 65-74, or Male] Above score calculated as 2 points each if present [Age > 75, or Stroke/TIA/TE]   Myovue reviewed 11/17/16 normal not gated due to afib  Echo 11/17/16  EF 60-65%  MV repair intact no MR LA normal size  Had successful [facility] 12/12/16   June 11th after hectic work week noted rapid pulse on iwatch Back in afib office visit 01/24/17 started on amiodarone  Converted after 3 doses. Has "Emay" device to check pulse and single lead type ECG  Had some left sided tension headaches but better now   ROS: Denies fever, malais, weight loss, blurry vision, decreased visual acuity, cough, sputum, SOB, hemoptysis, pleuritic pain, palpitaitons, heartburn, abdominal pain, melena, lower extremity edema, claudication, or rash.  All other systems reviewed and negative  General: BP 120/64   Pulse (!) 55   Ht 5\' 9"  (1.753  m)   Wt 167 lb 6 oz (75.9 kg)   SpO2 97%   BMI 24.72 kg/m  Affect appropriate Healthy:  appears stated age HEENT: normal Neck supple with no adenopathy JVP normal no bruits no thyromegaly Lungs clear with no wheezing and good diaphragmatic motion Heart:  S1/S2 no murmur, no rub, gallop or click PMI normal Abdomen: benighn, BS positve, no tenderness, no AAA no bruit.  No HSM or HJR Distal pulses intact with no bruits No edema Neuro non-focal Skin warm and dry No muscular weakness     Current Outpatient Prescriptions  Medication Sig Dispense Refill  . amiodarone (PACERONE) 200 MG tablet Take 400 mg (2 tablets) by mouth twice daily for 2 weeks, then take 200 mg by mouth twice daily 84 tablet 3  . aspirin EC 81 MG tablet Take 81 mg by mouth daily.    [name] atorvastatin (LIPITOR) 10 MG tablet Take 10 mg by mouth daily.    . Multiple Vitamins-Minerals (MULTIVITAMIN WITH MINERALS) tablet Take 1 tablet by mouth daily.    . nitroGLYCERIN (NITROSTAT) 0.4 MG SL tablet Place 1 tablet (0.4 mg total) under the tongue every 5 (five) minutes as needed for chest pain. 25 tablet 3  . rivaroxaban (XARELTO) 20 MG TABS tablet Take 1 tablet (20 mg total) by mouth daily with supper. 90 tablet 3  . zolpidem (AMBIEN) 10 MG tablet TAKE 1 TABLET BY MOUTH  AT BEDTIME AS NEEDED FOR SLEEP 15 tablet 3   Current Facility-Administered Medications  Medication Dose Route Frequency Provider Last Rate Last Dose  . 0.9 %  sodium chloride infusion  500 mL Intravenous Continuous Milus Banister, MD        Allergies  Patient has no known allergies.  Electrocardiogram:  08/20/15 SR rate 60 LAE nonspecific ST T wave changes  11/03/16 Afib rate 86 PVC afib Is new compared to previous  afib rate 82  01/24/17  afib rate 72 nonspecific ST chagne s  Assessment and Plan CAD/CABG:  2010  Stable no angina continue ASA/Statin  Baseline HR low so no beta blocker. Myovue 11/17/16 normal  Chol:  Will call [place] office  for most recent labs continue statin   Anxiety/Depression:  Still seems to have issues with stress  F/u primary consider SSRI  Afib:  CHA2VASC 1 for vascular disease [place] successful 12/12/16 Reverted back to afib Started on amiodarone 01/24/17 and converted quickly Check DLCO hope to stop amiodarone 5m to 12 months continue xarelto    [employer]

## 2017-03-29 ENCOUNTER — Ambulatory Visit (INDEPENDENT_AMBULATORY_CARE_PROVIDER_SITE_OTHER): Payer: 59 | Admitting: Cardiovascular Disease

## 2017-03-29 ENCOUNTER — Encounter: Payer: Self-pay | Admitting: Cardiovascular Disease

## 2017-03-29 VITALS — BP 120/64 | HR 55 | Ht 69.0 in | Wt 167.4 lb

## 2017-03-29 DIAGNOSIS — Z79899 Other long term (current) drug therapy: Secondary | ICD-10-CM | POA: Diagnosis not present

## 2017-03-29 NOTE — Patient Instructions (Addendum)
Medication Instructions:  Your physician recommends that you continue on your current medications as directed. Please refer to the Current Medication list given to you today.   Labwork: NONE ORDERED  Testing/Procedures: 1. Your physician has recommended that you have a pulmonary function test. Pulmonary Function Tests are a group of tests that measure how well air moves in and out of your lungs. TO BE DONE AT [facility]    Follow-Up: Your physician wants you to follow-up in: 6 MONTHS WITH DR. Blima Singer will receive a reminder letter in the mail two months in advance. If you don't receive a letter, please call our office to schedule the follow-up appointment.   Any Other Special Instructions Will Be Listed Below (If Applicable).     If you need a refill on your cardiac medications before your next appointment, please call your pharmacy.

## 2017-04-10 ENCOUNTER — Encounter (HOSPITAL_COMMUNITY): Payer: 59

## 2017-04-11 DIAGNOSIS — I25728 Atherosclerosis of autologous artery coronary artery bypass graft(s) with other forms of angina pectoris: Secondary | ICD-10-CM | POA: Diagnosis not present

## 2017-04-11 DIAGNOSIS — Z1389 Encounter for screening for other disorder: Secondary | ICD-10-CM | POA: Diagnosis not present

## 2017-04-11 DIAGNOSIS — Z23 Encounter for immunization: Secondary | ICD-10-CM | POA: Diagnosis not present

## 2017-04-11 DIAGNOSIS — Z0000 Encounter for general adult medical examination without abnormal findings: Secondary | ICD-10-CM | POA: Diagnosis not present

## 2017-04-11 DIAGNOSIS — I4891 Unspecified atrial fibrillation: Secondary | ICD-10-CM | POA: Diagnosis not present

## 2017-04-12 ENCOUNTER — Ambulatory Visit (HOSPITAL_COMMUNITY)
Admission: RE | Admit: 2017-04-12 | Discharge: 2017-04-12 | Disposition: A | Discharge status: Home / Self Care | Payer: 59 | Source: Ambulatory Visit | Attending: Cardiovascular Disease | Admitting: Cardiovascular Disease

## 2017-04-12 DIAGNOSIS — Z79899 Other long term (current) drug therapy: Secondary | ICD-10-CM | POA: Diagnosis not present

## 2017-04-12 LAB — PULMONARY FUNCTION TEST
DL/VA % PRED: 89 %
DL/VA: 4.07 ml/min/mmHg/L
DLCO UNC % PRED: 89 %
DLCO UNC: 27.01 ml/min/mmHg
FEF 25-75 POST: 4.82 L/s
FEF 25-75 PRE: 3.25 L/s
FEF2575-%Change-Post: 48 %
FEF2575-%PRED-PRE: 110 %
FEF2575-%Pred-Post: 164 %
FEV1-%Change-Post: 8 %
FEV1-%Pred-Post: 118 %
FEV1-%Pred-Pre: 109 %
FEV1-Post: 4.12 L
FEV1-Pre: 3.81 L
FEV1FVC-%Change-Post: 10 %
FEV1FVC-%PRED-PRE: 104 %
FEV6-%CHANGE-POST: -2 %
FEV6-%PRED-POST: 106 %
FEV6-%PRED-PRE: 108 %
FEV6-Post: 4.65 L
FEV6-Pre: 4.75 L
FEV6FVC-%CHANGE-POST: 0 %
FEV6FVC-%Pred-Post: 105 %
FEV6FVC-%Pred-Pre: 104 %
FVC-%Change-Post: -2 %
FVC-%PRED-POST: 101 %
FVC-%Pred-Pre: 104 %
FVC-Post: 4.66 L
FVC-Pre: 4.77 L
POST FEV1/FVC RATIO: 88 %
PRE FEV1/FVC RATIO: 80 %
Post FEV6/FVC ratio: 100 %
Pre FEV6/FVC Ratio: 100 %
RV % pred: 131 %
RV: 2.78 L
TLC % pred: 110 %
TLC: 7.41 L

## 2017-04-12 MED ORDER — ALBUTEROL SULFATE (2.5 MG/3ML) 0.083% IN NEBU
2.5000 mg | INHALATION_SOLUTION | Freq: Once | RESPIRATORY_TRACT | Status: AC
  Administered 2017-04-12: 2.5 mg via RESPIRATORY_TRACT

## 2017-04-25 ENCOUNTER — Ambulatory Visit: Payer: 59 | Admitting: Cardiology

## 2017-05-30 DIAGNOSIS — H1849 Other corneal degeneration: Secondary | ICD-10-CM | POA: Diagnosis not present

## 2017-06-01 ENCOUNTER — Encounter: Payer: Self-pay | Admitting: Cardiovascular Disease

## 2017-06-05 ENCOUNTER — Telehealth: Payer: Self-pay | Admitting: Cardiovascular Disease

## 2017-06-05 NOTE — Telephone Encounter (Signed)
Called patient's wife back. Informed patient's wife about message Dr. [name] sent to patient through [place]. Patient is worried about going back into A. FIB. Informed patient's wife that patient needs to stop amiodarone, and if A. FIB recurs patient will be referred to EP and consider ablation. Informed patient's wife that patient might not go into A. FIB, but if he does, there is a plan in place. The patient is still on xarelto, which protects him from blood clots. Patient's wife verbalized understanding.

## 2017-06-05 NOTE — Telephone Encounter (Signed)
New Message     Pt c/o medication issue:  1. Name of Medication: amiodarone (PACERONE) 200 MG tablet  2. How are you currently taking this medication (dosage and times per day)?  Take 400 mg (2 tablets) by mouth twice daily for 2 weeks, then take 200 mg by mouth twice daily  3. Are you having a reaction (difficulty breathing--STAT)? yes  4. What is your medication issue? Damage to his eyes , he doesn't want to just stop it while he is traveling, should he ween hisself off of it, or is there something else that can replace it?   Refer to letter in [place]

## 2017-10-19 ENCOUNTER — Other Ambulatory Visit: Payer: Self-pay | Admitting: *Deleted

## 2017-10-19 MED ORDER — RIVAROXABAN 20 MG PO TABS: 20.0000 mg | ORAL_TABLET | Freq: Every day | ORAL | 1 refills | Status: DC

## 2017-10-20 NOTE — Progress Notes (Signed)
Patient ID: Travis Cook, male   DOB: 09-25-1960, 57 y.o.   MRN: [id number]   56 y.o. CABG 06/2009 Travis Cook [name] to [name] [name] OM and [name] [name].with MV repair.  Developed PAF  11/03/16 CHA2DS2 1 [place] 12/12/16 reverted back 01/24/17 started on amiodarone with conversion. 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  All other systems reviewed and negative  General: BP 106/68   Pulse (!) 56   Ht 5\' 9"  (1.753 m)   Wt 166 lb 4 oz (75.4 kg)   BMI 24.55 kg/m  Affect appropriate Healthy:  appears stated age 67: normal Neck supple with no adenopathy JVP normal no bruits no thyromegaly Lungs clear with no wheezing and good diaphragmatic motion Heart:  S1/S2 no murmur, no rub, gallop or click PMI normal post sternotomy  Abdomen: benighn, BS positve, no tenderness, no AAA no bruit.  No HSM or HJR Distal pulses intact with no bruits No edema Neuro non-focal Skin warm and dry No muscular weakness      Current Outpatient Medications  Medication Sig Dispense Refill  . aspirin EC 81 MG tablet Take 81 mg by mouth daily.    [name] atorvastatin (LIPITOR) 10 MG tablet Take 10 mg by mouth daily.    . Multiple Vitamins-Minerals (MULTIVITAMIN WITH MINERALS) tablet Take 1 tablet by mouth daily.    . nitroGLYCERIN (NITROSTAT) 0.4 MG SL tablet Place 1 tablet (0.4 mg total) under the tongue every 5 (five) minutes as needed for chest pain. 25 tablet 3  . rivaroxaban (XARELTO) 20 MG TABS tablet Take 1 tablet (20 mg total) by mouth daily with supper. 90 tablet 1  . zolpidem  (AMBIEN) 10 MG tablet TAKE 1 TABLET BY MOUTH AT BEDTIME AS NEEDED FOR SLEEP 15 tablet 3   Current Facility-Administered Medications  Medication Dose Route Frequency Provider Last Rate Last Dose  . 0.9 %  sodium chloride infusion  500 mL Intravenous Continuous Travis Banister, MD        Allergies  Patient has no known allergies.  Electrocardiogram:  08/20/15 SR rate 60 LAE nonspecific ST T wave changes  11/03/16 Afib rate 86 PVC afib Is new compared to previous  afib rate 82  01/24/17  afib rate 72 nonspecific ST chagne s  Assessment and Plan CAD/CABG:  2010  Stable no angina continue ASA/Statin  Baseline HR low so no beta blocker. 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

## 2017-10-22 ENCOUNTER — Encounter: Payer: Self-pay | Admitting: Cardiovascular Disease

## 2017-10-22 ENCOUNTER — Ambulatory Visit: Payer: 59 | Admitting: Cardiovascular Disease

## 2017-10-22 VITALS — BP 106/68 | HR 56 | Ht 69.0 in | Wt 166.2 lb

## 2017-10-22 DIAGNOSIS — I4891 Unspecified atrial fibrillation: Secondary | ICD-10-CM | POA: Diagnosis not present

## 2017-10-22 DIAGNOSIS — E785 Hyperlipidemia, unspecified: Secondary | ICD-10-CM

## 2017-10-22 DIAGNOSIS — I25810 Atherosclerosis of coronary artery bypass graft(s) without angina pectoris: Secondary | ICD-10-CM

## 2017-10-22 MED ORDER — NITROGLYCERIN 0.4 MG SL SUBL: 0.4000 mg | SUBLINGUAL_TABLET | SUBLINGUAL | 3 refills | Status: DC | PRN

## 2017-10-22 NOTE — Patient Instructions (Signed)

## 2018-01-18 ENCOUNTER — Encounter: Payer: Self-pay | Admitting: Cardiovascular Disease

## 2018-01-21 ENCOUNTER — Telehealth: Payer: Self-pay

## 2018-01-21 NOTE — Telephone Encounter (Signed)
Called and left message for patient to call back, due to his My Chart message below.   "Dr. [name],  Unfortunately, my Afib returned last evening. I call your office Monday to see if I can get an appt. at end of next week.  Travis Cook"

## 2018-01-25 NOTE — Telephone Encounter (Signed)
Follow up    Pt returning call for Pam about Afib. Please call

## 2018-01-25 NOTE — Telephone Encounter (Signed)
Patient's wife is calling back and wanted to make an appointment with Dr. [name] instead of A. FIB. Made an appointment for patient to see Dr. [name] on Monday.

## 2018-01-28 ENCOUNTER — Encounter: Payer: Self-pay | Admitting: Cardiovascular Disease

## 2018-01-28 ENCOUNTER — Ambulatory Visit: Payer: 59 | Admitting: Cardiovascular Disease

## 2018-01-28 VITALS — BP 86/74 | HR 63 | Ht 69.0 in | Wt 166.8 lb

## 2018-01-28 DIAGNOSIS — I10 Essential (primary) hypertension: Secondary | ICD-10-CM

## 2018-01-28 DIAGNOSIS — I4891 Unspecified atrial fibrillation: Secondary | ICD-10-CM

## 2018-01-28 DIAGNOSIS — E785 Hyperlipidemia, unspecified: Secondary | ICD-10-CM | POA: Diagnosis not present

## 2018-01-28 DIAGNOSIS — I25810 Atherosclerosis of coronary artery bypass graft(s) without angina pectoris: Secondary | ICD-10-CM

## 2018-01-28 LAB — BASIC METABOLIC PANEL
BUN/Creatinine Ratio: 19 (ref 9–20)
BUN: 17 mg/dL (ref 6–24)
CALCIUM: 9 mg/dL (ref 8.7–10.2)
CHLORIDE: 106 mmol/L (ref 96–106)
CO2: 23 mmol/L (ref 20–29)
Creatinine, Ser: 0.91 mg/dL (ref 0.76–1.27)
GFR calc non Af Amer: 93 mL/min/{1.73_m2} (ref >59)
GFR, EST AFRICAN AMERICAN: 107 mL/min/{1.73_m2} (ref >59)
Glucose: 88 mg/dL (ref 65–99)
Potassium: 4.7 mmol/L (ref 3.5–5.2)
Sodium: 142 mmol/L (ref 134–144)

## 2018-01-28 LAB — CBC WITH DIFFERENTIAL/PLATELET
BASOS: 0 %
Basophils Absolute: 0 10*3/uL (ref 0.0–0.2)
EOS (ABSOLUTE): 0.1 10*3/uL (ref 0.0–0.4)
EOS: 2 %
HEMOGLOBIN: 14.7 g/dL (ref 13.0–17.7)
Hematocrit: 43.1 % (ref 37.5–51.0)
IMMATURE GRANS (ABS): 0 10*3/uL (ref 0.0–0.1)
IMMATURE GRANULOCYTES: 0 %
LYMPHS: 25 %
Lymphocytes Absolute: 1.5 10*3/uL (ref 0.7–3.1)
MCH: 31.1 pg (ref 26.6–33.0)
MCHC: 34.1 g/dL (ref 31.5–35.7)
MCV: 91 fL (ref 79–97)
Monocytes Absolute: 0.6 10*3/uL (ref 0.1–0.9)
Monocytes: 10 %
NEUTROS PCT: 63 %
Neutrophils Absolute: 3.7 10*3/uL (ref 1.4–7.0)
PLATELETS: 197 10*3/uL (ref 150–450)
RBC: 4.73 x10E6/uL (ref 4.14–5.80)
RDW: 13.6 % (ref 12.3–15.4)
WBC: 5.9 10*3/uL (ref 3.4–10.8)

## 2018-01-28 NOTE — Progress Notes (Addendum)
Patient ID: Travis Cook, male   DOB: 01-22-1961, 57 y.o.   MRN: [id number]   56 y.o. CABG 06/2009 [name] [name] to [name] [name] OM and [name] [name].with MV repair.  Developed PAF  11/03/16 CHA2DS2 1 [place] 12/12/16 reverted back 01/24/17 started on amiodarone with conversion. 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 He was at a meeting in [place] and had an unusually cold Glass of water This vagally mediated event seemed to put him back in afib  Also has had some exercise intolerance and dizziness since then BP is soft No bleeding issues Has not missed any doses of xarelto  Discussed options of sotolol , tikosyn or ablation Pro's and Con's of each I would favor ablation since he is young and fairly symptomatic with his PAf That has recurred twice now and he has had side effects with amiodarone   ROS: Denies fever, malais, weight loss, blurry vision, decreased visual acuity, cough, sputum, SOB, hemoptysis, pleuritic pain, palpitaitons, heartburn, abdominal pain, melena, lower extremity edema, claudication, or rash.  All other systems reviewed and negative  General: BP (!) 86/74   Pulse 63   Ht 5\' 9"  (1.753 m)   Wt 166 lb 12 oz (75.6 kg)   SpO2 99%   BMI 24.62 kg/m  Affect appropriate Healthy:  appears stated age 47: normal Neck supple with no adenopathy JVP normal no bruits no thyromegaly Lungs clear with no wheezing and good diaphragmatic motion Heart:  S1/S2 no murmur, no rub, gallop or click PMI normal post sternotomy  Abdomen: benighn, BS positve, no tenderness, no AAA no bruit.  No HSM or HJR Distal pulses intact with no bruits No edema Neuro non-focal Skin warm and dry No muscular  weakness      Current Outpatient Medications  Medication Sig Dispense Refill  . aspirin EC 81 MG tablet Take 81 mg by mouth daily.    [name] atorvastatin (LIPITOR) 10 MG tablet Take 10 mg by mouth daily.    . Multiple Vitamins-Minerals (MULTIVITAMIN WITH MINERALS) tablet Take 1 tablet by mouth daily.    . nitroGLYCERIN (NITROSTAT) 0.4 MG SL tablet Place 1 tablet (0.4 mg total) under the tongue every 5 (five) minutes as needed for chest pain. 25 tablet 3  . rivaroxaban (XARELTO) 20 MG TABS tablet Take 1 tablet (20 mg total) by mouth daily with supper. 90 tablet 1  . zolpidem (AMBIEN) 10 MG tablet TAKE 1 TABLET BY MOUTH AT BEDTIME AS NEEDED FOR SLEEP 15 tablet 3   Current Facility-Administered Medications  Medication Dose Route Frequency Provider Last Rate Last Dose  . 0.9 %  sodium chloride infusion  500 mL Intravenous Continuous Milus Banister, MD        Allergies  Patient has no known allergies.  Electrocardiogram:  08/20/15 SR rate 60 LAE nonspecific ST T wave changes  11/03/16 Afib rate 86 PVC afib Is new compared to previous  afib rate 82  01/24/17  afib rate 72 nonspecific ST chagne s  01/28/18  afib rate 78 QT 382 no acute changes   Assessment and Plan CAD/CABG:  2010  Stable no angina continue ASA/Statin  Baseline  HR low so no beta blocker. Myovue 11/17/16 normal  Chol:  Will call [place] office for most recent labs continue statin   Anxiety/Depression:  Still seems to have issues with stress  F/u primary consider SSRI  Afib:  CHA2VASC 1 for vascular disease [place] successful 12/12/16 Reverted back to afib Started on amiodarone 01/24/17 and converted quickly  Optho concerned Patient developed vortex keratopathy with amiodarone so stopped October 2018 needs f/u with  Dr Sabra Heck but subjectively vision seems better will check BMET and CBC today both for low BP and pre procedure / drug trial. To see EP regarding choice of ablation vs AAT with Sotolol or tikosyn.   Time spent  reviewing chart and direct patient care 45 minutes    [name]

## 2018-01-28 NOTE — Patient Instructions (Addendum)
Medication Instructions:  Your physician recommends that you continue on your current medications as directed. Please refer to the Current Medication list given to you today.  Labwork: Your physician recommends that you have lab work today- BMET and CBC.   Testing/Procedures: NONE  Follow-Up: Your physician wants you to follow-up ASAP with Dr. Rayann Heman or Dr. Curt Bears for Atrial Fib to discuss Ablation.   If you need a refill on your cardiac medications before your next appointment, please call your pharmacy.

## 2018-01-29 ENCOUNTER — Ambulatory Visit: Payer: 59 | Admitting: Cardiology

## 2018-01-29 ENCOUNTER — Encounter: Payer: Self-pay | Admitting: Cardiology

## 2018-01-29 VITALS — BP 110/76 | HR 85 | Ht 69.0 in | Wt 169.0 lb

## 2018-01-29 DIAGNOSIS — E785 Hyperlipidemia, unspecified: Secondary | ICD-10-CM | POA: Diagnosis not present

## 2018-01-29 DIAGNOSIS — I4819 Other persistent atrial fibrillation: Secondary | ICD-10-CM

## 2018-01-29 DIAGNOSIS — I481 Persistent atrial fibrillation: Secondary | ICD-10-CM

## 2018-01-29 DIAGNOSIS — I25810 Atherosclerosis of coronary artery bypass graft(s) without angina pectoris: Secondary | ICD-10-CM

## 2018-01-29 NOTE — Progress Notes (Signed)
Electrophysiology Office Note   Date:  01/29/2018   ID:  Travis Cook, DOB 08/11/1961, MRN [id number]  PCP:  [name], MD  Cardiologist:  [name] Primary Electrophysiologist:  Roxy Filler Meredith Leeds, MD    No chief complaint on file.    History of Present Illness: Travis Cook is a 57 y.o. male who is being seen today for the evaluation of atrial fibrillation at the request of [name]. Presenting today for electrophysiology evaluation.  History of CABG in 2010, paroxysmal atrial fibrillation which developed in 2018 and is status post cardioversion 12/12/2016.  He reverted back to atrial fibrillation 01/24/2017 and was started on amiodarone.  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  Per the patient, he went back into atrial fibrillation 01/20/2018.  He was at a meeting in [place] and had a cold glass of water.  It seems that this was a vagally mediated event.  He has had exercise intolerance and dizziness associated with his atrial fibrillation.  His symptoms are weakness and fatigue.  He says that there are times that he feels symptoms and other times that he feels okay.  He is always required cardioversions for his atrial fibrillation.    Today, he denies symptoms of palpitations, chest pain, shortness of breath, orthopnea, PND, lower extremity edema, claudication, dizziness, presyncope, syncope, bleeding, or neurologic sequela. The patient is tolerating medications without difficulties.    Past Medical History:  Diagnosis Date  . Anxiety   . CAD (coronary artery disease) 2010   CAD    dr [name]  . History of kidney stones    2005  . HYPERLIPIDEMIA 08/29/2007   Qualifier: Diagnosis of  By: Scherrie Gerlach    . NEPHROLITHIASIS, HX OF 08/29/2007   Qualifier: Diagnosis of  By: Scherrie Gerlach     Past Surgical History:  Procedure Laterality Date  . CARDIAC CATHETERIZATION    . CARDIOVERSION N/A 12/12/2016   Procedure: CARDIOVERSION;  Surgeon: [name], MD;  Location: [facility] ENDOSCOPY;  Service: Cardiovascular;  Laterality: N/A;  . COLONOSCOPY    . CORONARY ARTERY BYPASS GRAFT     2010  . heart bypass     3 vessels 2010  . INGUINAL HERNIA REPAIR Left 01/16/2014   Procedure:  LEFT INGUINAL HERNIA REPAIR WITH ON-Q PUMP PLACEMENT;  Surgeon: [name], MD;  Location: [place];  Service: General;  Laterality: Left;  . INSERTION OF MESH Left 01/16/2014   Procedure: INSERTION OF MESH;  Surgeon: [name], MD;  Location: [place] [facility];  Service: General;  Laterality: Left;  . lithotripsy 2005    . POLYPECTOMY       Current Outpatient Medications  Medication Sig Dispense Refill  . aspirin EC 81 MG tablet Take 81 mg by mouth daily.    [name] atorvastatin (LIPITOR) 10 MG tablet Take 10 mg by mouth daily.    . Multiple Vitamins-Minerals (MULTIVITAMIN WITH MINERALS) tablet Take 1 tablet by mouth daily.    . nitroGLYCERIN (NITROSTAT) 0.4 MG SL tablet Place 1 tablet (0.4 mg total) under the tongue every 5 (five) minutes as needed for chest pain. 25 tablet 3  . rivaroxaban (XARELTO) 20 MG TABS tablet Take 1 tablet (20 mg total) by mouth daily with supper. 90 tablet 1  . zolpidem (AMBIEN) 10 MG tablet TAKE 1 TABLET BY MOUTH AT BEDTIME AS NEEDED FOR SLEEP 15 tablet 3   Current Facility-Administered Medications  Medication  Dose Route Frequency Provider Last Rate Last Dose  . 0.9 %  sodium chloride infusion  500 mL Intravenous Continuous Milus Banister, MD        Allergies:   Patient has no known allergies.   Social History:  The patient  reports that he has never smoked. He has never used smokeless tobacco. He reports that he drinks about 3.0 - 3.6 oz of alcohol per week. He reports that he does not use drugs.   Family History:  The patient's family history includes Heart disease (age of onset: 84) in his father.    ROS:  Please see the history of present illness.   Otherwise, review of systems is  positive for none.   All other systems are reviewed and negative.    PHYSICAL EXAM: VS:  BP 110/76   Pulse 85   Ht 5\' 9"  (1.753 m)   Wt 169 lb (76.7 kg)   SpO2 98%   BMI 24.96 kg/m  , BMI Body mass index is 24.96 kg/m. GEN: Well nourished, well developed, in no acute distress  HEENT: normal  Neck: no JVD, carotid bruits, or masses Cardiac: iRRR; no murmurs, rubs, or gallops,no edema  Respiratory:  clear to auscultation bilaterally, normal work of breathing GI: soft, nontender, nondistended, + BS MS: no deformity or atrophy  Skin: warm and dry Neuro:  Strength and sensation are intact Psych: euthymic mood, full affect  EKG:  EKG is not ordered today. Personal review of the ekg ordered 01/28/18 shows AF, rate 78  Recent Labs: 01/28/2018: BUN 17; Creatinine, Ser 0.91; Hemoglobin 14.7; Platelets 197; Potassium 4.7; Sodium 142    Lipid Panel     Component Value Date/Time   CHOL 168 08/15/2013 1037   TRIG 101.0 08/15/2013 1037   TRIG 67 05/31/2006 1254   HDL 51.30 08/15/2013 1037   CHOLHDL 3 08/15/2013 1037   VLDL 20.2 08/15/2013 1037   LDLCALC 97 08/15/2013 1037     Wt Readings from Last 3 Encounters:  01/29/18 169 lb (76.7 kg)  01/28/18 166 lb 12 oz (75.6 kg)  10/22/17 166 lb 4 oz (75.4 kg)      Other studies Reviewed: Additional studies/ records that were reviewed today include: TTE 11/17/16  Review of the above records today demonstrates:  - Left ventricle: The cavity size was normal. Wall thickness was   increased in a pattern of mild LVH. Systolic function was normal.   The estimated ejection fraction was in the range of 60% to 65%.   Wall motion was normal; there were no regional wall motion   abnormalities. The study is not technically sufficient to allow   evaluation of LV diastolic function. - Mitral valve: Prior mitral valve repair. Mildly thickened   leaflets . No stenosis. There was trivial regurgitation. - Left atrium: The atrium was normal in size. -  Tricuspid valve: There was mild regurgitation. - Pulmonary arteries: PA peak pressure: 23 mm Hg (S). - Inferior vena cava: The vessel was normal in size. The   respirophasic diameter changes were in the normal range (= 50%),   consistent with normal central venous pressure.   ASSESSMENT AND PLAN:  1.  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  Is back in atrial fibrillation with symptoms of weakness and fatigue.  Options include ablation versus sotalol or dofetilide.  At this point, he would prefer ablation.  Risks and benefits were discussed and include bleeding,  tamponade, heart block, stroke, damage to surrounding organs.  He understands these risks and is agreed to the procedure.  In the interim, due to his persistent atrial fibrillation, Armando Lauman plan for cardioversion.  This patients CHA2DS2-VASc Score and unadjusted Ischemic Stroke Rate (% per year) is equal to 0.6 % stroke rate/year from a score of 1  Above score calculated as 1 point each if present [CHF, HTN, DM, Vascular=MI/PAD/Aortic Plaque, Age if 65-74, or Male] Above score calculated as 2 points each if present [Age > 75, or Stroke/TIA/TE]  2.  Coronary artery disease status post CABG in 2010: Currently on aspirin and statin.  No beta-blocker due to low heart rate baseline.  3.  Hyperlipidemia: Per primary cardiology    Current medicines are reviewed at length with the patient today.   The patient does not have concerns regarding his medicines.  The following changes were made today:  none  Labs/ tests ordered today include:  No orders of the defined types were placed in this encounter.  Case discussed with primary cardiology  Disposition:   FU with [place] 1 months  Signed, Jantz Main Meredith Leeds, MD  01/29/2018 4:31 PM     [address] [place] [address] [phone] (office) [phone] (fax)

## 2018-01-29 NOTE — H&P (View-Only) (Signed)
Electrophysiology Office Note   Date:  01/29/2018   ID:  Travis Cook, DOB October 15, 1960, MRN [id number]  PCP:  Travis Hatchet, MD  Cardiologist:  Travis Cook Primary Electrophysiologist:  Travis Meredith Leeds, MD    No chief complaint on file.    History of Present Illness: Travis Cook is a 57 y.o. male who is being seen today for the evaluation of atrial fibrillation at the request of Travis Cook. Presenting today for electrophysiology evaluation.  History of CABG in 2010, paroxysmal atrial fibrillation which developed in 2018 and is status post cardioversion 12/12/2016.  He reverted back to atrial fibrillation 01/24/2017 and was started on amiodarone.  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  Per the patient, he went back into atrial fibrillation 01/20/2018.  He was at a meeting in [place] and had a cold glass of water.  It seems that this was a vagally mediated event.  He has had exercise intolerance and dizziness associated with his atrial fibrillation.  His symptoms are weakness and fatigue.  He says that there are times that he feels symptoms and other times that he feels okay.  He is always required cardioversions for his atrial fibrillation.    Today, he denies symptoms of palpitations, chest pain, shortness of breath, orthopnea, PND, lower extremity edema, claudication, dizziness, presyncope, syncope, bleeding, or neurologic sequela. The patient is tolerating medications without difficulties.    Past Medical History:  Diagnosis Date  . Anxiety   . CAD (coronary artery disease) 2010   CAD    dr Travis Cook  . History of kidney stones    2005  . HYPERLIPIDEMIA 08/29/2007   Qualifier: Diagnosis of  By: Scherrie Gerlach    . NEPHROLITHIASIS, HX OF 08/29/2007   Qualifier: Diagnosis of  By: Scherrie Gerlach     Past Surgical History:  Procedure Laterality Date  . CARDIAC CATHETERIZATION    . CARDIOVERSION N/A 12/12/2016   Procedure: CARDIOVERSION;  Surgeon: Travis Hector, MD;  Location: [facility] ENDOSCOPY;  Service: Cardiovascular;  Laterality: N/A;  . COLONOSCOPY    . CORONARY ARTERY BYPASS GRAFT     2010  . heart bypass     3 vessels 2010  . INGUINAL HERNIA REPAIR Left 01/16/2014   Procedure:  LEFT INGUINAL HERNIA REPAIR WITH ON-Q PUMP PLACEMENT;  Surgeon: Travis Hollingshead, MD;  Location: [place];  Service: General;  Laterality: Left;  . INSERTION OF MESH Left 01/16/2014   Procedure: INSERTION OF MESH;  Surgeon: Travis Hollingshead, MD;  Location: [place];  Service: General;  Laterality: Left;  . lithotripsy 2005    . POLYPECTOMY       Current Outpatient Medications  Medication Sig Dispense Refill  . aspirin EC 81 MG tablet Take 81 mg by mouth daily.    [name] atorvastatin (LIPITOR) 10 MG tablet Take 10 mg by mouth daily.    . Multiple Vitamins-Minerals (MULTIVITAMIN WITH MINERALS) tablet Take 1 tablet by mouth daily.    . nitroGLYCERIN (NITROSTAT) 0.4 MG SL tablet Place 1 tablet (0.4 mg total) under the tongue every 5 (five) minutes as needed for chest pain. 25 tablet 3  . rivaroxaban (XARELTO) 20 MG TABS tablet Take 1 tablet (20 mg total) by mouth daily with supper. 90 tablet 1  . zolpidem (AMBIEN) 10 MG tablet TAKE 1 TABLET BY MOUTH AT BEDTIME AS NEEDED FOR SLEEP 15 tablet 3   Current Facility-Administered Medications  Medication  Dose Route Frequency Provider Last Rate Last Dose  . 0.9 %  sodium chloride infusion  500 mL Intravenous Continuous Travis Banister, MD        Allergies:   Patient has no known allergies.   Social History:  The patient  reports that he has never smoked. He has never used smokeless tobacco. He reports that he drinks about 3.0 - 3.6 oz of alcohol per week. He reports that he does not use drugs.   Family History:  The patient's family history includes Heart disease (age of onset: 56) in his father.    ROS:  Please see the history of present illness.   Otherwise, review of systems is  positive for none.   All other systems are reviewed and negative.    PHYSICAL EXAM: VS:  BP 110/76   Pulse 85   Ht 5\' 9"  (1.753 m)   Wt 169 lb (76.7 kg)   SpO2 98%   BMI 24.96 kg/m  , BMI Body mass index is 24.96 kg/m. GEN: Well nourished, well developed, in no acute distress  HEENT: normal  Neck: no JVD, carotid bruits, or masses Cardiac: iRRR; no murmurs, rubs, or gallops,no edema  Respiratory:  clear to auscultation bilaterally, normal work of breathing GI: soft, nontender, nondistended, + BS MS: no deformity or atrophy  Skin: warm and dry Neuro:  Strength and sensation are intact Psych: euthymic mood, full affect  EKG:  EKG is not ordered today. Personal review of the ekg ordered 01/28/18 shows AF, rate 78  Recent Labs: 01/28/2018: BUN 17; Creatinine, Ser 0.91; Hemoglobin 14.7; Platelets 197; Potassium 4.7; Sodium 142    Lipid Panel     Component Value Date/Time   CHOL 168 08/15/2013 1037   TRIG 101.0 08/15/2013 1037   TRIG 67 05/31/2006 1254   HDL 51.30 08/15/2013 1037   CHOLHDL 3 08/15/2013 1037   VLDL 20.2 08/15/2013 1037   LDLCALC 97 08/15/2013 1037     Wt Readings from Last 3 Encounters:  01/29/18 169 lb (76.7 kg)  01/28/18 166 lb 12 oz (75.6 kg)  10/22/17 166 lb 4 oz (75.4 kg)      Other studies Reviewed: Additional studies/ records that were reviewed today include: TTE 11/17/16  Review of the above records today demonstrates:  - Left ventricle: The cavity size was normal. Wall thickness was   increased in a pattern of mild LVH. Systolic function was normal.   The estimated ejection fraction was in the range of 60% to 65%.   Wall motion was normal; there were no regional wall motion   abnormalities. The study is not technically sufficient to allow   evaluation of LV diastolic function. - Mitral valve: Prior mitral valve repair. Mildly thickened   leaflets . No stenosis. There was trivial regurgitation. - Left atrium: The atrium was normal in size. -  Tricuspid valve: There was mild regurgitation. - Pulmonary arteries: PA peak pressure: 23 mm Hg (S). - Inferior vena cava: The vessel was normal in size. The   respirophasic diameter changes were in the normal range (= 50%),   consistent with normal central venous pressure.   ASSESSMENT AND PLAN:  1.  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  Is back in atrial fibrillation with symptoms of weakness and fatigue.  Options include ablation versus sotalol or dofetilide.  At this point, he would prefer ablation.  Risks and benefits were discussed and include bleeding,  tamponade, heart block, stroke, damage to surrounding organs.  He understands these risks and is agreed to the procedure.  In the interim, due to his persistent atrial fibrillation, Travis plan for cardioversion.  This patients CHA2DS2-VASc Score and unadjusted Ischemic Stroke Rate (% per year) is equal to 0.6 % stroke rate/year from a score of 1  Above score calculated as 1 point each if present [CHF, HTN, DM, Vascular=MI/PAD/Aortic Plaque, Age if 65-74, or Male] Above score calculated as 2 points each if present [Age > 75, or Stroke/TIA/TE]  2.  Coronary artery disease status post CABG in 2010: Currently on aspirin and statin.  No beta-blocker due to low heart rate baseline.  3.  Hyperlipidemia: Per primary cardiology    Current medicines are reviewed at length with the patient today.   The patient does not have concerns regarding his medicines.  The following changes were made today:  none  Labs/ tests ordered today include:  No orders of the defined types were placed in this encounter.  Case discussed with primary cardiology  Disposition:   FU with Travis Camnitz 1 months  Signed, Travis Meredith Leeds, MD  01/29/2018 4:31 PM     [address] [place] [address] [phone] (office) [phone] (fax)

## 2018-01-29 NOTE — Patient Instructions (Signed)
Medication Instructions:  Your physician recommends that you continue on your current medications as directed. Please refer to the Current Medication list given to you today.  Labwork: None ordered  Testing/Procedures: Your physician has recommended that you have a Cardioversion (DCCV). Electrical Cardioversion uses a jolt of electricity to your heart either through paddles or wired patches attached to your chest. This is a controlled, usually prescheduled, procedure. Defibrillation is done under light anesthesia in the hospital, and you usually go home the day of the procedure. This is done to get your heart back into a normal rhythm. You are not awake for the procedure.   Nurse will call to schedule this procedure.  Your physician has recommended that you have an ablation. Catheter ablation is a medical procedure used to treat some cardiac arrhythmias (irregular heartbeats). During catheter ablation, a long, thin, flexible tube is put into a blood vessel in your groin (upper thigh), or neck. This tube is called an ablation catheter. It is then guided to your heart through the blood vessel. Radio frequency waves destroy small areas of heart tissue where abnormal heartbeats may cause an arrhythmia to start.  Nurse will call you to schedule this procedure.   Follow-Up: To be determined after procedures are scheduled.   * If you need a refill on your cardiac medications before your next appointment, please call your pharmacy.   *Please note that any paperwork needing to be filled out by the provider will need to be addressed at the front desk prior to seeing the provider. Please note that any FMLA, disability or other documents regarding health condition is subject to a $25.00 charge that must be received prior to completion of paperwork in the form of a money order or check.  Thank you for choosing [facility]!!   Trinidad Curet, RN [phone]  Any Other Special Instructions Will Be  Listed Below (If Applicable).   Electrical Cardioversion Electrical cardioversion is the delivery of a jolt of electricity to restore a normal rhythm to the heart. A rhythm that is too fast or is not regular keeps the heart from pumping well. In this procedure, sticky patches or metal paddles are placed on the chest to deliver electricity to the heart from a device. This procedure may be done in an emergency if:  There is low or no blood pressure as a result of the heart rhythm.  Normal rhythm must be restored as fast as possible to protect the brain and heart from further damage.  It may save a life.  This procedure may also be done for irregular or fast heart rhythms that are not immediately life-threatening. Tell a health care provider about:  Any allergies you have.  All medicines you are taking, including vitamins, herbs, eye drops, creams, and over-the-counter medicines.  Any problems you or family members have had with anesthetic medicines.  Any blood disorders you have.  Any surgeries you have had.  Any medical conditions you have.  Whether you are pregnant or may be pregnant. What are the risks? Generally, this is a safe procedure. However, problems may occur, including:  Allergic reactions to medicines.  A blood clot that breaks free and travels to other parts of your body.  The possible return of an abnormal heart rhythm within hours or days after the procedure.  Your heart stopping (cardiac arrest). This is rare.  What happens before the procedure? Medicines  Your health care provider may have you start taking: ? Blood-thinning medicines (anticoagulants)  so your blood does not clot as easily. ? Medicines may be given to help stabilize your heart rate and rhythm.  Ask your health care provider about changing or stopping your regular medicines. This is especially important if you are taking diabetes medicines or blood thinners. General instructions  Plan  to have someone take you home from the hospital or clinic.  If you will be going home right after the procedure, plan to have someone with you for 24 hours.  Follow instructions from your health care provider about eating or drinking restrictions. What happens during the procedure?  To lower your risk of infection: ? Your health care team will wash or sanitize their hands. ? Your skin will be washed with soap.  An IV tube will be inserted into one of your veins.  You will be given a medicine to help you relax (sedative).  Sticky patches (electrodes) or metal paddles may be placed on your chest.  An electrical shock will be delivered. The procedure may vary among health care providers and hospitals. What happens after the procedure?  Your blood pressure, heart rate, breathing rate, and blood oxygen level will be monitored until the medicines you were given have worn off.  Do not drive for 24 hours if you were given a sedative.  Your heart rhythm will be watched to make sure it does not change. This information is not intended to replace advice given to you by your health care provider. Make sure you discuss any questions you have with your health care provider. Document Released: 07/21/2002 Document Revised: 03/29/2016 Document Reviewed: 02/04/2016 Elsevier Interactive Patient Education  2017 [employer].   Cardiac Ablation Cardiac ablation is a procedure to disable (ablate) a small amount of heart tissue in very specific places. The heart has many electrical connections. Sometimes these connections are abnormal and can cause the heart to beat very fast or irregularly. Ablating some of the problem areas can improve the heart rhythm or return it to normal. Ablation may be done for people who:  Have Wolff-Parkinson-White syndrome.  Have fast heart rhythms (tachycardia).  Have taken medicines for an abnormal heart rhythm (arrhythmia) that were not effective or caused side  effects.  Have a high-risk heartbeat that may be life-threatening.  During the procedure, a small incision is made in the neck or the groin, and a long, thin, flexible tube (catheter) is inserted into the incision and moved to the heart. Small devices (electrodes) on the tip of the catheter will send out electrical currents. A type of X-ray (fluoroscopy) will be used to help guide the catheter and to provide images of the heart. Tell a health care provider about:  Any allergies you have.  All medicines you are taking, including vitamins, herbs, eye drops, creams, and over-the-counter medicines.  Any problems you or family members have had with anesthetic medicines.  Any blood disorders you have.  Any surgeries you have had.  Any medical conditions you have, such as kidney failure.  Whether you are pregnant or may be pregnant. What are the risks? Generally, this is a safe procedure. However, problems may occur, including:  Infection.  Bruising and bleeding at the catheter insertion site.  Bleeding into the chest, especially into the sac that surrounds the heart. This is a serious complication.  Stroke or blood clots.  Damage to other structures or organs.  Allergic reaction to medicines or dyes.  Need for a permanent pacemaker if the normal electrical system is damaged. A  pacemaker is a small computer that sends electrical signals to the heart and helps your heart beat normally.  The procedure not being fully effective. This may not be recognized until months later. Repeat ablation procedures are sometimes required.  What happens before the procedure?  Follow instructions from your health care provider about eating or drinking restrictions.  Ask your health care provider about: ? Changing or stopping your regular medicines. This is especially important if you are taking diabetes medicines or blood thinners. ? Taking medicines such as aspirin and ibuprofen. These medicines  can thin your blood. Do not take these medicines before your procedure if your health care provider instructs you not to.  Plan to have someone take you home from the hospital or clinic.  If you will be going home right after the procedure, plan to have someone with you for 24 hours. What happens during the procedure?  To lower your risk of infection: ? Your health care team will wash or sanitize their hands. ? Your skin will be washed with soap. ? Hair may be removed from the incision area.  An IV tube will be inserted into one of your veins.  You will be given a medicine to help you relax (sedative).  The skin on your neck or groin will be numbed.  An incision will be made in your neck or your groin.  A needle will be inserted through the incision and into a large vein in your neck or groin.  A catheter will be inserted into the needle and moved to your heart.  Dye may be injected through the catheter to help your surgeon see the area of the heart that needs treatment.  Electrical currents will be sent from the catheter to ablate heart tissue in desired areas. There are three types of energy that may be used to ablate heart tissue: ? Heat (radiofrequency energy). ? Laser energy. ? Extreme cold (cryoablation).  When the necessary tissue has been ablated, the catheter will be removed.  Pressure will be held on the catheter insertion area to prevent excessive bleeding.  A bandage (dressing) will be placed over the catheter insertion area. The procedure may vary among health care providers and hospitals. What happens after the procedure?  Your blood pressure, heart rate, breathing rate, and blood oxygen level will be monitored until the medicines you were given have worn off.  Your catheter insertion area will be monitored for bleeding. You will need to lie still for a few hours to ensure that you do not bleed from the catheter insertion area.  Do not drive for 24 hours or as  long as directed by your health care provider. Summary  Cardiac ablation is a procedure to disable (ablate) a small amount of heart tissue in very specific places. Ablating some of the problem areas can improve the heart rhythm or return it to normal.  During the procedure, electrical currents will be sent from the catheter to ablate heart tissue in desired areas. This information is not intended to replace advice given to you by your health care provider. Make sure you discuss any questions you have with your health care provider. Document Released: 12/17/2008 Document Revised: 06/19/2016 Document Reviewed: 06/19/2016 Elsevier Interactive Patient Education  [employer].

## 2018-01-30 ENCOUNTER — Telehealth: Payer: Self-pay | Admitting: Cardiology

## 2018-01-30 DIAGNOSIS — I480 Paroxysmal atrial fibrillation: Secondary | ICD-10-CM

## 2018-01-30 NOTE — Telephone Encounter (Signed)
New Message   Pt's wife is calling, states they have been waiting on a call to tell them when the pt's cardio version will be scheduled. Please call

## 2018-02-01 ENCOUNTER — Encounter: Payer: Self-pay | Admitting: *Deleted

## 2018-02-01 MED ORDER — METOPROLOL TARTRATE 50 MG PO TABS: ORAL_TABLET | ORAL | 0 refills | Status: DC

## 2018-02-01 NOTE — Telephone Encounter (Signed)
DCCV scheduled for 7/01. Instructions reviewed w/ wife who will relay to pt. Aware to arrive at 7:30 day of procedure.  NPO after MN night before Hold medications morning of. Must have driver home.   AFib ablation scheduled for 7/23. Letter of instructions r/v w/ wife and sent via [place]. She is aware office will contact them to arrange cardiac CT.  Instruction letter sent via [place].  Lopressor tablet, for CT, sent to pharmacy.  Post ablation f/u appts made. Wife verbalized understanding and agreeable to plan.

## 2018-02-01 NOTE — Telephone Encounter (Signed)
Pt's wife calling   Returning your call to concerning her husband

## 2018-02-11 ENCOUNTER — Encounter (HOSPITAL_COMMUNITY): Payer: Self-pay | Admitting: *Deleted

## 2018-02-11 ENCOUNTER — Ambulatory Visit (HOSPITAL_COMMUNITY)
Admission: RE | Admit: 2018-02-11 | Discharge: 2018-02-11 | Disposition: A | Discharge status: Home / Self Care | Payer: 59 | Source: Ambulatory Visit | Attending: Internal Medicine | Admitting: Internal Medicine

## 2018-02-11 ENCOUNTER — Encounter (HOSPITAL_COMMUNITY)
Admission: RE | Disposition: A | Discharge status: Home / Self Care | Payer: Self-pay | Source: Ambulatory Visit | Attending: Internal Medicine

## 2018-02-11 ENCOUNTER — Ambulatory Visit (HOSPITAL_COMMUNITY): Payer: 59 | Admitting: Certified Registered Nurse Anesthetist

## 2018-02-11 ENCOUNTER — Other Ambulatory Visit: Payer: Self-pay

## 2018-02-11 DIAGNOSIS — Z9889 Other specified postprocedural states: Secondary | ICD-10-CM | POA: Insufficient documentation

## 2018-02-11 DIAGNOSIS — Z87442 Personal history of urinary calculi: Secondary | ICD-10-CM | POA: Insufficient documentation

## 2018-02-11 DIAGNOSIS — I480 Paroxysmal atrial fibrillation: Secondary | ICD-10-CM | POA: Diagnosis present

## 2018-02-11 DIAGNOSIS — Z7982 Long term (current) use of aspirin: Secondary | ICD-10-CM | POA: Diagnosis not present

## 2018-02-11 DIAGNOSIS — I2510 Atherosclerotic heart disease of native coronary artery without angina pectoris: Secondary | ICD-10-CM | POA: Diagnosis not present

## 2018-02-11 DIAGNOSIS — I4891 Unspecified atrial fibrillation: Secondary | ICD-10-CM

## 2018-02-11 DIAGNOSIS — F419 Anxiety disorder, unspecified: Secondary | ICD-10-CM | POA: Diagnosis not present

## 2018-02-11 DIAGNOSIS — E785 Hyperlipidemia, unspecified: Secondary | ICD-10-CM | POA: Diagnosis not present

## 2018-02-11 DIAGNOSIS — Z79899 Other long term (current) drug therapy: Secondary | ICD-10-CM | POA: Insufficient documentation

## 2018-02-11 DIAGNOSIS — Z951 Presence of aortocoronary bypass graft: Secondary | ICD-10-CM | POA: Diagnosis not present

## 2018-02-11 DIAGNOSIS — Z7901 Long term (current) use of anticoagulants: Secondary | ICD-10-CM | POA: Diagnosis not present

## 2018-02-11 DIAGNOSIS — Z8249 Family history of ischemic heart disease and other diseases of the circulatory system: Secondary | ICD-10-CM | POA: Insufficient documentation

## 2018-02-11 HISTORY — PX: CARDIOVERSION: SHX1299

## 2018-02-11 SURGERY — CARDIOVERSION
Anesthesia: General

## 2018-02-11 MED ORDER — PROPOFOL 10 MG/ML IV BOLUS
INTRAVENOUS | Status: DC | PRN
  Administered 2018-02-11: 100 mg via INTRAVENOUS

## 2018-02-11 MED ORDER — LIDOCAINE HCL (CARDIAC) PF 100 MG/5ML IV SOSY
PREFILLED_SYRINGE | INTRAVENOUS | Status: DC | PRN
  Administered 2018-02-11: 30 mg via INTRAVENOUS

## 2018-02-11 MED ORDER — SODIUM CHLORIDE 0.9 % IV SOLN
INTRAVENOUS | Status: DC
  Administered 2018-02-11: 08:00:00 via INTRAVENOUS

## 2018-02-11 NOTE — Transfer of Care (Signed)
Immediate Anesthesia Transfer of Care Note  Patient: Travis Cook  Procedure(s) Performed: CARDIOVERSION (N/A )  Patient Location: Endoscopy Unit  Anesthesia Type:General  Level of Consciousness: awake, alert , oriented and patient cooperative  Airway & Oxygen Therapy: Patient Spontanous Breathing and Patient connected to nasal cannula oxygen  Post-op Assessment: Report given to RN and Post -op Vital signs reviewed and stable  Post vital signs: Reviewed and stable  Last Vitals:  Vitals Value Taken Time  BP    Temp    Pulse    Resp    SpO2      Last Pain:  Vitals:   02/11/18 0750  TempSrc: Oral  PainSc: 0-No pain         Complications: No apparent anesthesia complications

## 2018-02-11 NOTE — Anesthesia Preprocedure Evaluation (Addendum)
Anesthesia Evaluation  Patient identified by MRN, date of birth, ID band Patient awake    Reviewed: Allergy & Precautions, NPO status , Patient's Chart, lab work & pertinent test results  Airway Mallampati: II  TM Distance: >3 FB Neck ROM: Full    Dental  (+) Teeth Intact   Pulmonary    breath sounds clear to auscultation       Cardiovascular  Rhythm:Irregular Rate:Normal     Neuro/Psych    GI/Hepatic   Endo/Other    Renal/GU      Musculoskeletal   Abdominal   Peds  Hematology   Anesthesia Other Findings   Reproductive/Obstetrics                            Anesthesia Physical Anesthesia Plan  ASA: III  Anesthesia Plan: General   Post-op Pain Management:    Induction: Intravenous  PONV Risk Score and Plan:   Airway Management Planned: Mask  Additional Equipment:   Intra-op Plan:   Post-operative Plan:   Informed Consent: I have reviewed the patients History and Physical, chart, labs and discussed the procedure including the risks, benefits and alternatives for the proposed anesthesia with the patient or authorized representative who has indicated his/her understanding and acceptance.     Plan Discussed with:   Anesthesia Plan Comments:         Anesthesia Quick Evaluation

## 2018-02-11 NOTE — Anesthesia Postprocedure Evaluation (Signed)
Anesthesia Post Note  Patient: Travis Cook  Procedure(s) Performed: CARDIOVERSION (N/A )     Patient location during evaluation: Endoscopy Anesthesia Type: General Level of consciousness: awake and alert Pain management: pain level controlled Vital Signs Assessment: post-procedure vital signs reviewed and stable Respiratory status: spontaneous breathing, nonlabored ventilation, respiratory function stable and patient connected to nasal cannula oxygen Cardiovascular status: blood pressure returned to baseline and stable Postop Assessment: no apparent nausea or vomiting Anesthetic complications: no    Last Vitals:  Vitals:   02/11/18 0916 02/11/18 0927  BP: 104/74 109/88  Pulse: (!) 58 60  Resp: 17 19  Temp: 36.6 C   SpO2: 96% 97%    Last Pain:  Vitals:   02/11/18 0927  TempSrc:   PainSc: 0-No pain                 [name]

## 2018-02-11 NOTE — Interval H&P Note (Signed)
History and Physical Interval Note:  02/11/2018 8:17 AM  Travis Cook  has presented today for surgery, with the diagnosis of afib  The various methods of treatment have been discussed with the patient and family. After consideration of risks, benefits and other options for treatment, the patient has consented to  Procedure(s): CARDIOVERSION (N/A) as a surgical intervention .  The patient's history has been reviewed, patient examined, no change in status, stable for surgery.  I have reviewed the patient's chart and labs.  Questions were answered to the patient's satisfaction.     [name]

## 2018-02-11 NOTE — CV Procedure (Signed)
DC Cardioversion  Patient anesthetized with propofol by anesthesia  With pads in AP position, patient cardioverted to SR with  200 J synchronized biphasic energy  Procedure was without complication  12 lead EKG pending

## 2018-02-11 NOTE — Discharge Instructions (Signed)
Electrical Cardioversion, Care After °This sheet gives you information about how to care for yourself after your procedure. Your health care provider may also give you more specific instructions. If you have problems or questions, contact your health care provider. °What can I expect after the procedure? °After the procedure, it is common to have: °· Some redness on the skin where the shocks were given. ° °Follow these instructions at home: °· Do not drive for 24 hours if you were given a medicine to help you relax (sedative). °· Take over-the-counter and prescription medicines only as told by your health care provider. °· Ask your health care provider how to check your pulse. Check it often. °· Rest for 48 hours after the procedure or as told by your health care provider. °· Avoid or limit your caffeine use as told by your health care provider. °Contact a health care provider if: °· You feel like your heart is beating too quickly or your pulse is not regular. °· You have a serious muscle cramp that does not go away. °Get help right away if: °· You have discomfort in your chest. °· You are dizzy or you feel faint. °· You have trouble breathing or you are short of breath. °· Your speech is slurred. °· You have trouble moving an arm or leg on one side of your body. °· Your fingers or toes turn cold or blue. °This information is not intended to replace advice given to you by your health care provider. Make sure you discuss any questions you have with your health care provider. °Document Released: 05/21/2013 Document Revised: 03/03/2016 Document Reviewed: 02/04/2016 °Elsevier Interactive Patient Education © 2018 [employer] ° °

## 2018-02-12 ENCOUNTER — Encounter (HOSPITAL_COMMUNITY): Payer: Self-pay | Admitting: Internal Medicine

## 2018-02-20 IMAGING — NM NM MISC PROCEDURE
4 series · 24 of 24 positions shown · non-contrast
Comparison: none

[Series 1: wbr_s-proj_st stress · 6.40mm/px · 6 of 64 frames shown]
[frame 6/64]
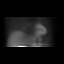
[frame 16/64]
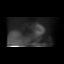
[frame 27/64]
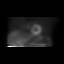
[frame 38/64]
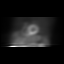
[frame 48/64]
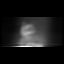
[frame 59/64]
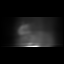

[Series 1: stress · 6.40mm/px · 6 of 64 frames shown]
[frame 6/64]
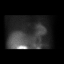
[frame 16/64]
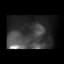
[frame 27/64]
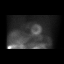
[frame 38/64]
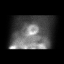
[frame 48/64]
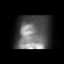
[frame 59/64]
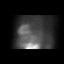

[Series 1: wbr_r-proj_st rest · 6.40mm/px · 6 of 64 frames shown]
[frame 6/64]
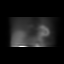
[frame 16/64]
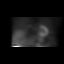
[frame 27/64]
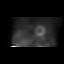
[frame 38/64]
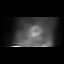
[frame 48/64]
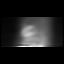
[frame 59/64]
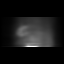

[Series 1: rest · 6.40mm/px · 6 of 64 frames shown]
[frame 6/64]
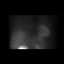
[frame 16/64]
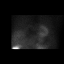
[frame 27/64]
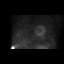
[frame 38/64]
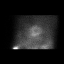
[frame 48/64]
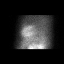
[frame 59/64]
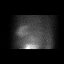

[24 of 24 positions shown; findings below may reference images not displayed]

Canned report from images found in remote index.

Refer to host system for actual result text.

## 2018-03-01 ENCOUNTER — Ambulatory Visit (HOSPITAL_COMMUNITY)
Admission: RE | Admit: 2018-03-01 | Discharge: 2018-03-01 | Disposition: A | Discharge status: Home / Self Care | Payer: 59 | Source: Ambulatory Visit | Attending: Cardiology | Admitting: Cardiology

## 2018-03-01 DIAGNOSIS — I4891 Unspecified atrial fibrillation: Secondary | ICD-10-CM | POA: Diagnosis not present

## 2018-03-01 DIAGNOSIS — I480 Paroxysmal atrial fibrillation: Secondary | ICD-10-CM

## 2018-03-01 DIAGNOSIS — Z951 Presence of aortocoronary bypass graft: Secondary | ICD-10-CM | POA: Diagnosis not present

## 2018-03-01 MED ORDER — IOPAMIDOL (ISOVUE-370) INJECTION 76%
INTRAVENOUS | Status: AC
  Filled 2018-03-01: qty 100

## 2018-03-01 MED ORDER — IOPAMIDOL (ISOVUE-370) INJECTION 76%
100.0000 mL | Freq: Once | INTRAVENOUS | Status: AC | PRN
  Administered 2018-03-01: 100 mL via INTRAVENOUS

## 2018-03-04 ENCOUNTER — Encounter: Payer: Self-pay | Admitting: Cardiovascular Disease

## 2018-03-05 ENCOUNTER — Encounter (HOSPITAL_COMMUNITY): Payer: Self-pay | Admitting: Certified Registered Nurse Anesthetist

## 2018-03-05 ENCOUNTER — Encounter (HOSPITAL_COMMUNITY)
Admission: RE | Disposition: A | Discharge status: Home / Self Care | Payer: Self-pay | Source: Ambulatory Visit | Attending: Cardiology

## 2018-03-05 ENCOUNTER — Ambulatory Visit (HOSPITAL_COMMUNITY): Payer: 59 | Admitting: Certified Registered Nurse Anesthetist

## 2018-03-05 ENCOUNTER — Ambulatory Visit (HOSPITAL_COMMUNITY)
Admission: RE | Admit: 2018-03-05 | Discharge: 2018-03-06 | Disposition: A | Discharge status: Home / Self Care | Payer: 59 | Source: Ambulatory Visit | Attending: Cardiology | Admitting: Cardiology

## 2018-03-05 ENCOUNTER — Other Ambulatory Visit: Payer: Self-pay

## 2018-03-05 DIAGNOSIS — E785 Hyperlipidemia, unspecified: Secondary | ICD-10-CM | POA: Diagnosis not present

## 2018-03-05 DIAGNOSIS — I481 Persistent atrial fibrillation: Secondary | ICD-10-CM

## 2018-03-05 DIAGNOSIS — I4819 Other persistent atrial fibrillation: Secondary | ICD-10-CM | POA: Diagnosis present

## 2018-03-05 DIAGNOSIS — I2510 Atherosclerotic heart disease of native coronary artery without angina pectoris: Secondary | ICD-10-CM | POA: Diagnosis not present

## 2018-03-05 DIAGNOSIS — F419 Anxiety disorder, unspecified: Secondary | ICD-10-CM | POA: Diagnosis not present

## 2018-03-05 DIAGNOSIS — Z7901 Long term (current) use of anticoagulants: Secondary | ICD-10-CM | POA: Insufficient documentation

## 2018-03-05 DIAGNOSIS — Z8249 Family history of ischemic heart disease and other diseases of the circulatory system: Secondary | ICD-10-CM | POA: Insufficient documentation

## 2018-03-05 DIAGNOSIS — Z951 Presence of aortocoronary bypass graft: Secondary | ICD-10-CM | POA: Diagnosis not present

## 2018-03-05 DIAGNOSIS — I4891 Unspecified atrial fibrillation: Secondary | ICD-10-CM | POA: Diagnosis not present

## 2018-03-05 HISTORY — PX: ATRIAL FIBRILLATION ABLATION: EP1191

## 2018-03-05 LAB — CBC
HCT: 44.3 % (ref 39.0–52.0)
Hemoglobin: 14.7 g/dL (ref 13.0–17.0)
MCH: 30.6 pg (ref 26.0–34.0)
MCHC: 33.2 g/dL (ref 30.0–36.0)
MCV: 92.1 fL (ref 78.0–100.0)
PLATELETS: 162 10*3/uL (ref 150–400)
RBC: 4.81 MIL/uL (ref 4.22–5.81)
RDW: 12.5 % (ref 11.5–15.5)
WBC: 5 10*3/uL (ref 4.0–10.5)

## 2018-03-05 LAB — POCT ACTIVATED CLOTTING TIME
Activated Clotting Time: 307 seconds
Activated Clotting Time: 329 seconds
Activated Clotting Time: 318 seconds
Activated Clotting Time: 357 seconds
Activated Clotting Time: 153 seconds

## 2018-03-05 LAB — BASIC METABOLIC PANEL
Anion gap: 6 (ref 5–15)
BUN: 8 mg/dL (ref 6–20)
CHLORIDE: 107 mmol/L (ref 98–111)
CO2: 27 mmol/L (ref 22–32)
CREATININE: 0.8 mg/dL (ref 0.61–1.24)
Calcium: 9.5 mg/dL (ref 8.9–10.3)
GFR calc Af Amer: >60 mL/min (ref >60)
GFR calc non Af Amer: >60 mL/min (ref >60)
Glucose, Bld: 91 mg/dL (ref 70–99)
Potassium: 4.5 mmol/L (ref 3.5–5.1)
Sodium: 140 mmol/L (ref 135–145)

## 2018-03-05 LAB — MRSA PCR SCREENING: MRSA BY PCR: NEGATIVE

## 2018-03-05 SURGERY — ATRIAL FIBRILLATION ABLATION
Anesthesia: General

## 2018-03-05 MED ORDER — HEPARIN SODIUM (PORCINE) 1000 UNIT/ML IJ SOLN
INTRAMUSCULAR | Status: DC | PRN
  Administered 2018-03-05: 1000 [IU] via INTRAVENOUS

## 2018-03-05 MED ORDER — ACETAMINOPHEN 325 MG PO TABS
650.0000 mg | ORAL_TABLET | ORAL | Status: DC | PRN
  Administered 2018-03-06: 650 mg via ORAL
  Filled 2018-03-05: qty 2

## 2018-03-05 MED ORDER — HEPARIN SODIUM (PORCINE) 1000 UNIT/ML IJ SOLN
INTRAMUSCULAR | Status: DC | PRN
  Administered 2018-03-05: 14000 [IU] via INTRAVENOUS
  Administered 2018-03-05: 3000 [IU] via INTRAVENOUS
  Administered 2018-03-05: 2000 [IU] via INTRAVENOUS

## 2018-03-05 MED ORDER — DOBUTAMINE IN D5W 4-5 MG/ML-% IV SOLN
INTRAVENOUS | Status: AC
  Filled 2018-03-05: qty 250

## 2018-03-05 MED ORDER — SODIUM CHLORIDE 0.9% FLUSH
3.0000 mL | Freq: Two times a day (BID) | INTRAVENOUS | Status: DC
  Administered 2018-03-05: 3 mL via INTRAVENOUS

## 2018-03-05 MED ORDER — BUPIVACAINE HCL (PF) 0.25 % IJ SOLN
INTRAMUSCULAR | Status: DC | PRN
  Administered 2018-03-05: 30 mL

## 2018-03-05 MED ORDER — ONDANSETRON HCL 4 MG/2ML IJ SOLN: 4.0000 mg | Freq: Four times a day (QID) | INTRAMUSCULAR | Status: DC | PRN

## 2018-03-05 MED ORDER — ZOLPIDEM TARTRATE 5 MG PO TABS
5.0000 mg | ORAL_TABLET | Freq: Every evening | ORAL | Status: DC | PRN
  Administered 2018-03-06: 5 mg via ORAL
  Filled 2018-03-05: qty 1

## 2018-03-05 MED ORDER — PHENYLEPHRINE 40 MCG/ML (10ML) SYRINGE FOR IV PUSH (FOR BLOOD PRESSURE SUPPORT)
PREFILLED_SYRINGE | INTRAVENOUS | Status: DC | PRN
  Administered 2018-03-05: 80 ug via INTRAVENOUS

## 2018-03-05 MED ORDER — HEPARIN (PORCINE) IN NACL 1000-0.9 UT/500ML-% IV SOLN
INTRAVENOUS | Status: AC
  Filled 2018-03-05: qty 500

## 2018-03-05 MED ORDER — RIVAROXABAN 20 MG PO TABS
20.0000 mg | ORAL_TABLET | Freq: Every day | ORAL | Status: DC
  Administered 2018-03-05: 20 mg via ORAL
  Filled 2018-03-05: qty 1

## 2018-03-05 MED ORDER — NITROGLYCERIN 0.4 MG SL SUBL: 0.4000 mg | SUBLINGUAL_TABLET | SUBLINGUAL | Status: DC | PRN

## 2018-03-05 MED ORDER — SODIUM CHLORIDE 0.9 % IV SOLN
INTRAVENOUS | Status: DC
  Administered 2018-03-05: 10:00:00 via INTRAVENOUS

## 2018-03-05 MED ORDER — HEPARIN SODIUM (PORCINE) 1000 UNIT/ML IJ SOLN
INTRAMUSCULAR | Status: AC
  Filled 2018-03-05: qty 1

## 2018-03-05 MED ORDER — ADULT MULTIVITAMIN W/MINERALS CH
1.0000 | ORAL_TABLET | Freq: Every day | ORAL | Status: DC
Start: 2018-03-05 — End: 2018-03-06
  Administered 2018-03-05 – 2018-03-06 (×2): 1 via ORAL
  Filled 2018-03-05 (×4): qty 1

## 2018-03-05 MED ORDER — HEPARIN (PORCINE) IN NACL 1000-0.9 UT/500ML-% IV SOLN: INTRAVENOUS | Status: DC | PRN

## 2018-03-05 MED ORDER — SODIUM CHLORIDE 0.9% FLUSH: 3.0000 mL | INTRAVENOUS | Status: DC | PRN

## 2018-03-05 MED ORDER — HEPARIN (PORCINE) IN NACL 1000-0.9 UT/500ML-% IV SOLN
INTRAVENOUS | Status: DC | PRN
  Administered 2018-03-05 (×5): 500 mL

## 2018-03-05 MED ORDER — HYDROMORPHONE HCL 1 MG/ML IJ SOLN: 0.2500 mg | INTRAMUSCULAR | Status: DC | PRN

## 2018-03-05 MED ORDER — LIDOCAINE HCL (CARDIAC) PF 100 MG/5ML IV SOSY
PREFILLED_SYRINGE | INTRAVENOUS | Status: DC | PRN
  Administered 2018-03-05: 70 mg via INTRAVENOUS

## 2018-03-05 MED ORDER — DOBUTAMINE IN D5W 4-5 MG/ML-% IV SOLN
INTRAVENOUS | Status: DC | PRN
  Administered 2018-03-05: 20 ug/kg/min via INTRAVENOUS

## 2018-03-05 MED ORDER — SODIUM CHLORIDE 0.9 % IV SOLN
INTRAVENOUS | Status: DC | PRN
  Administered 2018-03-05: 20 ug/min via INTRAVENOUS

## 2018-03-05 MED ORDER — PROTAMINE SULFATE 10 MG/ML IV SOLN
INTRAVENOUS | Status: DC | PRN
  Administered 2018-03-05 (×4): 10 mg via INTRAVENOUS

## 2018-03-05 MED ORDER — ASPIRIN EC 81 MG PO TBEC
81.0000 mg | DELAYED_RELEASE_TABLET | Freq: Every day | ORAL | Status: DC
  Administered 2018-03-05 – 2018-03-06 (×2): 81 mg via ORAL
  Filled 2018-03-05 (×2): qty 1

## 2018-03-05 MED ORDER — OXYCODONE HCL 5 MG PO TABS: 5.0000 mg | ORAL_TABLET | Freq: Once | ORAL | Status: DC | PRN

## 2018-03-05 MED ORDER — MIDAZOLAM HCL 5 MG/5ML IJ SOLN
INTRAMUSCULAR | Status: DC | PRN
  Administered 2018-03-05: 2 mg via INTRAVENOUS

## 2018-03-05 MED ORDER — BUPIVACAINE HCL (PF) 0.25 % IJ SOLN
INTRAMUSCULAR | Status: AC
  Filled 2018-03-05: qty 30

## 2018-03-05 MED ORDER — FENTANYL CITRATE (PF) 100 MCG/2ML IJ SOLN
INTRAMUSCULAR | Status: DC | PRN
  Administered 2018-03-05 (×2): 50 ug via INTRAVENOUS

## 2018-03-05 MED ORDER — OXYCODONE HCL 5 MG/5ML PO SOLN: 5.0000 mg | Freq: Once | ORAL | Status: DC | PRN

## 2018-03-05 MED ORDER — ATORVASTATIN CALCIUM 10 MG PO TABS
10.0000 mg | ORAL_TABLET | Freq: Every day | ORAL | Status: DC
  Administered 2018-03-05: 10 mg via ORAL
  Filled 2018-03-05 (×2): qty 1

## 2018-03-05 MED ORDER — SODIUM CHLORIDE 0.9 % IV SOLN: 250.0000 mL | INTRAVENOUS | Status: DC | PRN

## 2018-03-05 MED ORDER — SUGAMMADEX SODIUM 200 MG/2ML IV SOLN
INTRAVENOUS | Status: DC | PRN
  Administered 2018-03-05: 200 mg via INTRAVENOUS

## 2018-03-05 MED ORDER — ONDANSETRON HCL 4 MG/2ML IJ SOLN
INTRAMUSCULAR | Status: DC | PRN
  Administered 2018-03-05: 4 mg via INTRAVENOUS

## 2018-03-05 MED ORDER — PROMETHAZINE HCL 25 MG/ML IJ SOLN: 6.2500 mg | INTRAMUSCULAR | Status: DC | PRN

## 2018-03-05 MED ORDER — ROCURONIUM BROMIDE 10 MG/ML (PF) SYRINGE
PREFILLED_SYRINGE | INTRAVENOUS | Status: DC | PRN
  Administered 2018-03-05 (×2): 10 mg via INTRAVENOUS
  Administered 2018-03-05: 40 mg via INTRAVENOUS

## 2018-03-05 MED ORDER — PROPOFOL 10 MG/ML IV BOLUS
INTRAVENOUS | Status: DC | PRN
  Administered 2018-03-05 (×3): 50 mg via INTRAVENOUS

## 2018-03-05 SURGICAL SUPPLY — 18 items
CATH MAPPNG PENTARAY F 2-6-2MM (CATHETERS) IMPLANT
CATH SMTCH THERMOCOOL SF DF (CATHETERS) ×1 IMPLANT
CATH SOUNDSTAR 3D IMAGING (CATHETERS) ×1 IMPLANT
CATH WEBSTER BI DIR CS D-F CRV (CATHETERS) ×1 IMPLANT
COVER SWIFTLINK CONNECTOR (BAG) ×1 IMPLANT
PACK EP LATEX FREE (CUSTOM PROCEDURE TRAY) ×2
PACK EP LF (CUSTOM PROCEDURE TRAY) ×1 IMPLANT
PAD DEFIB LIFELINK (PAD) ×1 IMPLANT
PATCH CARTO3 (PAD) ×1 IMPLANT
PENTARAY F 2-6-2MM (CATHETERS) ×2
SHEATH AVANTI 11F 11CM (SHEATH) ×2 IMPLANT
SHEATH BAYLIS SUREFLEX  M 8.5 (SHEATH) ×2
SHEATH BAYLIS SUREFLEX M 8.5 (SHEATH) IMPLANT
SHEATH BAYLIS TRANSSEPTAL 98CM (NEEDLE) ×1 IMPLANT
SHEATH PINNACLE 7F 10CM (SHEATH) ×1 IMPLANT
SHEATH PINNACLE 8F 10CM (SHEATH) ×2 IMPLANT
SHEATH PINNACLE 9F 10CM (SHEATH) ×2 IMPLANT
TUBING SMART ABLATE COOLFLOW (TUBING) ×1 IMPLANT

## 2018-03-05 NOTE — H&P (Signed)
Electrophysiology Office Note   Date:  03/05/2018   ID:  Travis Cook, DOB Oct 31, 1960, MRN [id number]  PCP:  [name], MD  Cardiologist:  [name] Primary Electrophysiologist:  Sondi Desch Meredith Leeds, MD     History of Present Illness: Travis Cook is a 57 y.o. male who is being seen today for the evaluation of atrial fibrillation at the request of [name]. Presenting today for electrophysiology evaluation.  History of CABG in 2010, paroxysmal atrial fibrillation which developed in 2018 and is status post cardioversion 12/12/2016.  He reverted back to atrial fibrillation 01/24/2017 and was started on amiodarone.  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  Per the patient, he went back into atrial fibrillation 01/20/2018.  He was at a meeting in [place] and had a cold glass of water.  It seems that this was a vagally mediated event.  He has had exercise intolerance and dizziness associated with his atrial fibrillation.  His symptoms are weakness and fatigue.  He says that there are times that he feels symptoms and other times that he feels okay.  He is always required cardioversions for his atrial fibrillation.  Today, denies symptoms of palpitations, chest pain, shortness of breath, orthopnea, PND, lower extremity edema, claudication, dizziness, presyncope, syncope, bleeding, or neurologic sequela. The patient is tolerating medications without difficulties.     Past Medical History:  Diagnosis Date  . Anxiety   . CAD (coronary artery disease) 2010   CAD    dr [name]  . History of kidney stones    2005  . HYPERLIPIDEMIA 08/29/2007   Qualifier: Diagnosis of  By: Scherrie Gerlach    . NEPHROLITHIASIS, HX OF 08/29/2007   Qualifier: Diagnosis of  By: Scherrie Gerlach     Past Surgical History:  Procedure Laterality Date  . CARDIAC CATHETERIZATION    . CARDIOVERSION N/A 12/12/2016   Procedure: CARDIOVERSION;  Surgeon: [name], MD;  Location: [facility] ENDOSCOPY;  Service: Cardiovascular;  Laterality: N/A;  . CARDIOVERSION N/A 02/11/2018   Procedure: CARDIOVERSION;  Surgeon: [name], MD;  Location: [place];  Service: Cardiovascular;  Laterality: N/A;  . COLONOSCOPY    . CORONARY ARTERY BYPASS GRAFT     2010  . heart bypass     3 vessels 2010  . INGUINAL HERNIA REPAIR Left 01/16/2014   Procedure:  LEFT INGUINAL HERNIA REPAIR WITH ON-Q PUMP PLACEMENT;  Surgeon: [name], MD;  Location: [place];  Service: General;  Laterality: Left;  . INSERTION OF MESH Left 01/16/2014   Procedure: INSERTION OF MESH;  Surgeon: [name], MD;  Location: [place];  Service: General;  Laterality: Left;  . lithotripsy 2005    . POLYPECTOMY       Current Facility-Administered Medications  Medication Dose Route Frequency Provider Last Rate Last Dose  . 0.9 %  sodium chloride infusion   Intravenous Continuous [name], MD 50 mL/hr at 03/05/18 1014      Allergies:   Patient has no known allergies.   Social History:  The patient  reports that he has never smoked. He has never used smokeless tobacco. He reports that he drinks about 3.0 - 3.6 oz of alcohol per week. He reports that he does not use drugs.   Family History:  The patient's family history includes Heart disease (age of onset: 4) in his father.   ROS:  Please see the history  of present illness.   Otherwise, review of systems is positive for none.   All other systems are reviewed and negative.   PHYSICAL EXAM: VS:  BP 120/83   Pulse (!) 58   Temp 98.3 F (36.8 C) (Oral)   Ht 5\' 9"  (1.753 m)   Wt 165 lb (74.8 kg)   SpO2 92%   BMI 24.37 kg/m  , BMI Body mass index is 24.37 kg/m. GEN: Well nourished, well developed, in no acute distress  HEENT: normal  Neck: no JVD, carotid bruits, or masses Cardiac: RRR; no murmurs, rubs, or gallops,no edema  Respiratory:  clear to auscultation bilaterally, normal work of breathing GI: soft,  nontender, nondistended, + BS MS: no deformity or atrophy  Skin: warm and dry Neuro:  Strength and sensation are intact Psych: euthymic mood, full affect  Recent Labs: 01/28/2018: BUN 17; Creatinine, Ser 0.91; Hemoglobin 14.7; Platelets 197; Potassium 4.7; Sodium 142    Lipid Panel     Component Value Date/Time   CHOL 168 08/15/2013 1037   TRIG 101.0 08/15/2013 1037   TRIG 67 05/31/2006 1254   HDL 51.30 08/15/2013 1037   CHOLHDL 3 08/15/2013 1037   VLDL 20.2 08/15/2013 1037   LDLCALC 97 08/15/2013 1037     Wt Readings from Last 3 Encounters:  03/05/18 165 lb (74.8 kg)  02/11/18 169 lb (76.7 kg)  01/29/18 169 lb (76.7 kg)      Other studies Reviewed: Additional studies/ records that were reviewed today include: TTE 11/17/16  Review of the above records today demonstrates:  - Left ventricle: The cavity size was normal. Wall thickness was   increased in a pattern of mild LVH. Systolic function was normal.   The estimated ejection fraction was in the range of 60% to 65%.   Wall motion was normal; there were no regional wall motion   abnormalities. The study is not technically sufficient to allow   evaluation of LV diastolic function. - Mitral valve: Prior mitral valve repair. Mildly thickened   leaflets . No stenosis. There was trivial regurgitation. - Left atrium: The atrium was normal in size. - Tricuspid valve: There was mild regurgitation. - Pulmonary arteries: PA peak pressure: 23 mm Hg (S). - Inferior vena cava: The vessel was normal in size. The   respirophasic diameter changes were in the normal range (= 50%),   consistent with normal central venous pressure.   ASSESSMENT AND PLAN:  1.  Paroxysmal atrial fibrillation:  Travis Cook has presented today for surgery, with the diagnosis of atrial fibrillation.  The various methods of treatment have been discussed with the patient and family. After consideration of risks, benefits and other options for treatment, the  patient has consented to  Procedure(s): Catheter ablation as a surgical intervention .  Risks include but not limited to bleeding, tamponade, heart block, stroke, damage to surrounding organs, among others. The patient's history has been reviewed, patient examined, no change in status, stable for surgery.  I have reviewed the patient's chart and labs.  Questions were answered to the patient's satisfaction.    Allegra Lai, MD 03/05/2018 10:19 AM      Signed, Leonie Amacher Meredith Leeds, MD  03/05/2018 10:19 AM

## 2018-03-05 NOTE — Anesthesia Postprocedure Evaluation (Signed)
Anesthesia Post Note  Patient: Faust Thorington Mckercher  Procedure(s) Performed: ATRIAL FIBRILLATION ABLATION (N/A )     Patient location during evaluation: PACU Anesthesia Type: General Level of consciousness: awake and alert Pain management: pain level controlled Vital Signs Assessment: post-procedure vital signs reviewed and stable Respiratory status: spontaneous breathing, nonlabored ventilation, respiratory function stable and patient connected to nasal cannula oxygen Cardiovascular status: blood pressure returned to baseline and stable Postop Assessment: no apparent nausea or vomiting Anesthetic complications: no    Last Vitals:  Vitals:   03/05/18 1617 03/05/18 1635  BP:  120/84  Pulse:  71  Resp:  13  Temp: (!) 36.4 C   SpO2:  97%    Last Pain:  Vitals:   03/05/18 1617  TempSrc: Temporal  PainSc: 0-No pain                 Jule Whitsel P Emmalea Treanor

## 2018-03-05 NOTE — Anesthesia Procedure Notes (Signed)
Procedure Name: Intubation Date/Time: 03/05/2018 11:32 AM Performed by: Carney Living, CRNA Pre-anesthesia Checklist: Patient identified, Emergency Drugs available, Suction available, Patient being monitored and Timeout performed Patient Re-evaluated:Patient Re-evaluated prior to induction Oxygen Delivery Method: Circle system utilized Preoxygenation: Pre-oxygenation with 100% oxygen Induction Type: IV induction Ventilation: Mask ventilation without difficulty Laryngoscope Size: Mac and 4 Grade View: Grade I Tube type: Oral Tube size: 7.5 mm Number of attempts: 1 Airway Equipment and Method: Stylet Placement Confirmation: ETT inserted through vocal cords under direct vision,  positive ETCO2 and breath sounds checked- equal and bilateral Secured at: 22 cm Tube secured with: Tape Dental Injury: Teeth and Oropharynx as per pre-operative assessment

## 2018-03-05 NOTE — Progress Notes (Signed)
Site area: RFV x 2   LFV x 2 Site Prior to Removal:  Level o/o Pressure Applied For:25 min rt---20 min left Manual:   yes Patient Status During Pull:  stable Post Pull Site:  Level 0/0 Post Pull Instructions Given:  yes Post Pull Pulses Present: palpable Dressing Applied:  clear Bedrest begins @ 5993 till 2215 Comments:

## 2018-03-05 NOTE — Progress Notes (Signed)
Admission from the EP lab by stretcher awake and alert. Instructed to avoid bending  both knees. Bedrest till 10 pm tonight.

## 2018-03-05 NOTE — Transfer of Care (Signed)
Immediate Anesthesia Transfer of Care Note  Patient: Travis Cook  Procedure(s) Performed: ATRIAL FIBRILLATION ABLATION (N/A )  Patient Location: Cath Lab  Anesthesia Type:General  Level of Consciousness: awake, alert , oriented and patient cooperative  Airway & Oxygen Therapy: Patient Spontanous Breathing and Patient connected to nasal cannula oxygen  Post-op Assessment: Report given to RN, Post -op Vital signs reviewed and stable and Patient moving all extremities X 4  Post vital signs: Reviewed and stable  Last Vitals:  Vitals Value Taken Time  BP 136/94 03/05/2018  2:53 PM  Temp    Pulse 94 03/05/2018  2:53 PM  Resp 8 03/05/2018  2:53 PM  SpO2 99 % 03/05/2018  2:53 PM  Vitals shown include unvalidated device data.  Last Pain:  Vitals:   03/05/18 1005  TempSrc:   PainSc: 0-No pain         Complications: No apparent anesthesia complications

## 2018-03-05 NOTE — Anesthesia Preprocedure Evaluation (Addendum)
Anesthesia Evaluation  Patient identified by MRN, date of birth, ID band Patient awake    Reviewed: Allergy & Precautions, NPO status , Patient's Chart, lab work & pertinent test results  Airway Mallampati: II  TM Distance: >3 FB Neck ROM: Full    Dental no notable dental hx. (+) Teeth Intact, Dental Advisory Given   Pulmonary neg pulmonary ROS,    Pulmonary exam normal breath sounds clear to auscultation       Cardiovascular + CAD and + CABG  Normal cardiovascular exam+ dysrhythmias Atrial Fibrillation  Rhythm:Regular Rate:Normal  ECG: SR, PAC's, rate 60  Sees cardiologist [facility])  Myocardial perfusion There was no ST segment deviation noted during stress. The study is normal. This is a low risk study. Normal resting and stress perfusion. No ischemia or infarction EF Not done due to afib  ECHO: LVEF 60-65%, mild LVH, normal wall motion, stable mitral valve repair, trivial MR, normal biatrial size, mild TR, RVSP 23 mmHg, normal IVC.  CT IMPRESSION: 1. No LAA thrombus 2. Mild RAE 3. Normal aortic root 3.0 cm 4. Normal PV anatomy with early bifurcation into two large branches of the RLPV 5. No pericardial effusion 6. Esophagus courses closest to the LLPV ostium 7. Patent [name] to [name], Patent [name];s to OM and [name] [name]   Neuro/Psych Anxiety negative neurological ROS     GI/Hepatic negative GI ROS, Neg liver ROS,   Endo/Other  negative endocrine ROS  Renal/GU negative Renal ROS     Musculoskeletal negative musculoskeletal ROS (+)   Abdominal   Peds  Hematology HLD   Anesthesia Other Findings A-fib  Reproductive/Obstetrics                           Anesthesia Physical Anesthesia Plan  ASA: III  Anesthesia Plan: General   Post-op Pain Management:    Induction: Intravenous  PONV Risk Score and Plan: 2  Airway Management Planned: Oral ETT  Additional  Equipment:   Intra-op Plan:   Post-operative Plan: Extubation in OR  Informed Consent: I have reviewed the patients History and Physical, chart, labs and discussed the procedure including the risks, benefits and alternatives for the proposed anesthesia with the patient or authorized representative who has indicated his/her understanding and acceptance.   Dental advisory given  Plan Discussed with: CRNA, Anesthesiologist and Surgeon  Anesthesia Plan Comments:        Anesthesia Quick Evaluation

## 2018-03-06 ENCOUNTER — Encounter (HOSPITAL_COMMUNITY): Payer: Self-pay | Admitting: Cardiology

## 2018-03-06 DIAGNOSIS — Z7901 Long term (current) use of anticoagulants: Secondary | ICD-10-CM | POA: Diagnosis not present

## 2018-03-06 DIAGNOSIS — Z951 Presence of aortocoronary bypass graft: Secondary | ICD-10-CM | POA: Diagnosis not present

## 2018-03-06 DIAGNOSIS — I2510 Atherosclerotic heart disease of native coronary artery without angina pectoris: Secondary | ICD-10-CM | POA: Diagnosis not present

## 2018-03-06 DIAGNOSIS — I481 Persistent atrial fibrillation: Secondary | ICD-10-CM | POA: Diagnosis not present

## 2018-03-06 NOTE — Progress Notes (Signed)
Right and left groin site CDI no bleeding observed at this time.

## 2018-03-06 NOTE — Progress Notes (Signed)
Patient called nurse to room observed small amount blood draining from left groin site pressure was applied and clean dressing to replace soiled dressing.  Patient denied discomfort at this time.  Will continue to monitor.

## 2018-03-06 NOTE — Progress Notes (Signed)
Discharge note. Patient and wife educated at bedside. RN educated on medications and when to take them, follow-up appointments, procedure site care and restrictions, and diet and activity recommendations. PIV removed without complications.   Pt discharged by wheelchair with NT.  Basil Dess, RN

## 2018-03-06 NOTE — Progress Notes (Signed)
Examined right & left groin site gauze dressing clean, dry, and intact.  Denies pain or discomfort.  Bedrest until 2215.  Will monitor.

## 2018-03-06 NOTE — Progress Notes (Signed)
Patient called for assist to bathroom.  Examined both right & left groin site both CDI at this time.  Patient assisted from bed ambulated slowly to bathroom then back to bed without distress or discomfort noted.  Will monitor.

## 2018-03-06 NOTE — Discharge Summary (Addendum)
ELECTROPHYSIOLOGY PROCEDURE DISCHARGE SUMMARY    Patient ID: Travis Cook,  MRN: [id number], DOB/AGE: [birth year] 57 y.o.  Admit date: 03/05/2018 Discharge date: 03/06/2018  Primary Care Physician: Travis Hatchet, MD  Primary Cardiologist: Dr. [name] Electrophysiologist: Dr. Curt Bears  Primary Discharge Diagnosis:  1. Paroxysmal AFib     CHA2DS2Vasc is one, on xarelto  Secondary Discharge Diagnosis:  1. CAD (CABG hx)  Procedures This Admission:  1.  Electrophysiology study and radiofrequency catheter ablation on 03/05/18 by Dr. Curt Bears.   This study demonstrated    CONCLUSIONS: 1. Sinus rhythm upon presentation.   2. Successful electrical isolation and anatomical encircling of all four pulmonary veins with radiofrequency current. 3. No inducible arrhythmias following ablation both on and off of dobutamine 4. No early apparent complications  Brief HPI: Travis Cook is a 57 y.o. male with a history of paroxysmal atrial fibrillation.  He has failed medical therapy with amiodarone. Risks, benefits, and alternatives to catheter ablation of atrial fibrillation were reviewed with the patient who wished to proceed.  The patient underwent cardiac CT prior to the procedure which demonstrated no  LAA thrombus.    Hospital Course:  The patient was admitted and underwent EPS/RFCA of atrial fibrillation with details as outlined above.  The patient was monitored on telemetry overnight which demonstrated SR.  B/l groins are without complication on the day of discharge.  The patient feels well, without CP or SOB, no groin pain b/l.  He has been OOB, he was examined by Dr. Curt Bears and considered to be stable for discharge.  Wound care and restrictions were reviewed with the patient by Dr. Curt Bears.  The patient Travis Cook be seen back by Travis Palau, NP in 4 weeks and Dr Curt Bears in 12 weeks for post ablation follow up.    Physical Exam: Vitals:   03/05/18 1948 03/05/18 2235 03/06/18 0006 03/06/18  0715  BP: 99/69 109/78 115/82 112/77  Pulse: 71   69  Resp: 15   11  Temp: 97.9 F (36.6 C) 97.7 F (36.5 C) 97.9 F (36.6 C) 98.1 F (36.7 C)  TempSrc: Oral  Oral Oral  SpO2: 97% 98%  97%  Weight:      Height:        GEN- The patient is well appearing, alert and oriented x 3 today.   HEENT: normocephalic, atraumatic; sclera clear, conjunctiva pink; hearing intact; oropharynx clear; neck supple  Lungs- CTA b/l, normal work of breathing.  No wheezes, rales, rhonchi Heart- RRR, no murmurs, rubs or gallops  GI- soft, non-tender, non-distended Extremities- no clubbing, cyanosis, or edema; b/l groin without hematoma/bruit MS- no significant deformity or atrophy Skin- warm and dry, no rash or lesion Psych- euthymic mood, full affect Neuro- no gross deficits appreciated   Labs:   Lab Results  Component Value Date   WBC 5.0 03/05/2018   HGB 14.7 03/05/2018   HCT 44.3 03/05/2018   MCV 92.1 03/05/2018   PLT 162 03/05/2018    Recent Labs  Lab 03/05/18 0954  NA 140  K 4.5  CL 107  CO2 27  BUN 8  CREATININE 0.80  CALCIUM 9.5  GLUCOSE 91     Discharge Medications:  Allergies as of 03/06/2018   No Known Allergies     Medication List    STOP taking these medications   metoprolol tartrate 50 MG tablet Commonly known as:  LOPRESSOR     TAKE these medications   aspirin EC 81 MG tablet Take 81  mg by mouth daily.   atorvastatin 10 MG tablet Commonly known as:  LIPITOR Take 10 mg by mouth daily.   multivitamin with minerals tablet Take 1 tablet by mouth daily.   nitroGLYCERIN 0.4 MG SL tablet Commonly known as:  NITROSTAT Place 1 tablet (0.4 mg total) under the tongue every 5 (five) minutes as needed for chest pain.   rivaroxaban 20 MG Tabs tablet Commonly known as:  XARELTO Take 1 tablet (20 mg total) by mouth daily with supper.   zolpidem 10 MG tablet Commonly known as:  AMBIEN TAKE 1 TABLET BY MOUTH AT BEDTIME AS NEEDED FOR SLEEP What changed:    how  much to take  how to take this  when to take this  reasons to take this  additional instructions       Disposition:  Home  Discharge Instructions    Diet - low sodium heart healthy   Complete by:  As directed    Increase activity slowly   Complete by:  As directed      Follow-up Information    [facility] Follow up on 03/26/2018.   Specialty:  Cardiology Why:  8:30AM Contact information: [address] [id number] [place] [address] [phone]       [name], MD Follow up on 06/11/2018.   Specialty:  Cardiology Why:  8:00AM Contact information: [address] [place] [address] [phone]        [name], MD Follow up on 05/06/2018.   Specialty:  Cardiology Why:  9:15AM  Contact information: [address] [place] [address] [phone]           Duration of Discharge Encounter: Greater than 30 minutes including physician time.  Venetia Night, PA-C 03/06/2018 7:57 AM   I have seen and examined this patient with Tommye Standard.  Agree with above, note added to reflect my findings.  On exam, RRR, no murmurs, lungs clear.  Patient had AF ablation for persistent atrial fibrillation. Tolerated the procedure well without complaint this morning. Plan for discharge today with follow up in EP clinic.  Allexus Ovens M. Pearla Mckinny MD 03/06/2018 8:11 AM

## 2018-03-08 ENCOUNTER — Encounter: Payer: Self-pay | Admitting: Cardiology

## 2018-03-08 ENCOUNTER — Telehealth: Payer: Self-pay | Admitting: Cardiovascular Disease

## 2018-03-08 NOTE — Telephone Encounter (Signed)
Patient's wife called back. Patient had blurred vision for about 10 minutes this morning and went back to normal. Patient had no other symptoms. Patient had an Ablation on Tuesday. Talked to DOD, Dr. Caryl Comes, who recommend patient to go to ED if he has a headache, or if his blurred vision comes back. Dr. Caryl Comes suggest patient call our office next week with an update. Patient does not have any headache and will go to ED if he has any signs and symptoms of a stroke. Informed patient's wife of the signs and symptoms of a stroke and to call 911 right away if any of these symptoms come on. Patient's wife verbalized understanding and will call next week with an update.

## 2018-03-08 NOTE — Telephone Encounter (Signed)
New Message    Patients wife is calling to on behalf of patient. She states that he recently had an ablation but today he is having issues with his vision. She is not sure if the two are related. Please call.

## 2018-03-15 NOTE — Telephone Encounter (Signed)
Left detailed message asking patient to let us know how he is doing via telephone/MyChart

## 2018-03-19 ENCOUNTER — Telehealth: Payer: Self-pay | Admitting: Cardiology

## 2018-03-19 NOTE — Telephone Encounter (Signed)
Left detailed message informing patient I was following up on his MyChart message last week. Instructed pt to only call back if he was still experiencing issues otherwise keep scheduled follow up.

## 2018-03-19 NOTE — Telephone Encounter (Signed)
Follow Up:    Pt said he was returning y our call from a few days ago. He said he was supposed to give you an update on his condition.

## 2018-04-05 ENCOUNTER — Ambulatory Visit (HOSPITAL_COMMUNITY)
Admission: RE | Admit: 2018-04-05 | Discharge: 2018-04-05 | Disposition: A | Discharge status: Home / Self Care | Payer: 59 | Source: Ambulatory Visit | Attending: Nurse Practitioner | Admitting: Nurse Practitioner

## 2018-04-05 ENCOUNTER — Encounter (HOSPITAL_COMMUNITY): Payer: Self-pay | Admitting: Nurse Practitioner

## 2018-04-05 VITALS — BP 110/64 | HR 73 | Ht 68.0 in | Wt 168.8 lb

## 2018-04-05 DIAGNOSIS — Z9889 Other specified postprocedural states: Secondary | ICD-10-CM | POA: Diagnosis not present

## 2018-04-05 DIAGNOSIS — I2510 Atherosclerotic heart disease of native coronary artery without angina pectoris: Secondary | ICD-10-CM | POA: Insufficient documentation

## 2018-04-05 DIAGNOSIS — Z8249 Family history of ischemic heart disease and other diseases of the circulatory system: Secondary | ICD-10-CM | POA: Insufficient documentation

## 2018-04-05 DIAGNOSIS — Z951 Presence of aortocoronary bypass graft: Secondary | ICD-10-CM | POA: Insufficient documentation

## 2018-04-05 DIAGNOSIS — E785 Hyperlipidemia, unspecified: Secondary | ICD-10-CM | POA: Insufficient documentation

## 2018-04-05 DIAGNOSIS — F419 Anxiety disorder, unspecified: Secondary | ICD-10-CM | POA: Insufficient documentation

## 2018-04-05 DIAGNOSIS — Z7982 Long term (current) use of aspirin: Secondary | ICD-10-CM | POA: Diagnosis not present

## 2018-04-05 DIAGNOSIS — I481 Persistent atrial fibrillation: Secondary | ICD-10-CM | POA: Diagnosis not present

## 2018-04-05 DIAGNOSIS — Z87442 Personal history of urinary calculi: Secondary | ICD-10-CM | POA: Insufficient documentation

## 2018-04-05 DIAGNOSIS — Z79899 Other long term (current) drug therapy: Secondary | ICD-10-CM | POA: Diagnosis not present

## 2018-04-05 DIAGNOSIS — Z7901 Long term (current) use of anticoagulants: Secondary | ICD-10-CM | POA: Insufficient documentation

## 2018-04-05 DIAGNOSIS — I4819 Other persistent atrial fibrillation: Secondary | ICD-10-CM

## 2018-04-05 NOTE — Progress Notes (Signed)
Primary Care Physician: [name], MD Referring Physician: Dr. Olena Heckle is a 57 y.o. male with a h/o afib, CAD, that is in the afib clinic for f/u ablation, 7/23. He reports that he has done very well. He has not appreciated any afib. No issues with swallowing or groin issues. He continues on xarelto.  Today, he denies symptoms of palpitations, chest pain, shortness of breath, orthopnea, PND, lower extremity edema, dizziness, presyncope, syncope, or neurologic sequela. The patient is tolerating medications without difficulties and is otherwise without complaint today.   Past Medical History:  Diagnosis Date  . Anxiety   . CAD (coronary artery disease) 2010   CAD    dr [name]  . History of kidney stones    2005  . HYPERLIPIDEMIA 08/29/2007   Qualifier: Diagnosis of  By: Scherrie Gerlach    . NEPHROLITHIASIS, HX OF 08/29/2007   Qualifier: Diagnosis of  By: Scherrie Gerlach     Past Surgical History:  Procedure Laterality Date  . ATRIAL FIBRILLATION ABLATION N/A 03/05/2018   Procedure: ATRIAL FIBRILLATION ABLATION;  Surgeon: [name], MD;  Location: [place] [facility];  Service: Cardiovascular;  Laterality: N/A;  . CARDIAC CATHETERIZATION    . CARDIOVERSION N/A 12/12/2016   Procedure: CARDIOVERSION;  Surgeon: [name], MD;  Location: [facility] ENDOSCOPY;  Service: Cardiovascular;  Laterality: N/A;  . CARDIOVERSION N/A 02/11/2018   Procedure: CARDIOVERSION;  Surgeon: [name], MD;  Location: [place];  Service: Cardiovascular;  Laterality: N/A;  . COLONOSCOPY    . CORONARY ARTERY BYPASS GRAFT     2010  . heart bypass     3 vessels 2010  . INGUINAL HERNIA REPAIR Left 01/16/2014   Procedure:  LEFT INGUINAL HERNIA REPAIR WITH ON-Q PUMP PLACEMENT;  Surgeon: [name], MD;  Location: [place];  Service: General;  Laterality: Left;  . INSERTION OF MESH Left 01/16/2014   Procedure: INSERTION OF MESH;  Surgeon: [name], MD;  Location: [place];   Service: General;  Laterality: Left;  . lithotripsy 2005    . POLYPECTOMY      Current Outpatient Medications  Medication Sig Dispense Refill  . aspirin EC 81 MG tablet Take 81 mg by mouth daily.    [name] atorvastatin (LIPITOR) 10 MG tablet Take 10 mg by mouth daily.    . Multiple Vitamins-Minerals (MULTIVITAMIN WITH MINERALS) tablet Take 1 tablet by mouth daily.    . nitroGLYCERIN (NITROSTAT) 0.4 MG SL tablet Place 1 tablet (0.4 mg total) under the tongue every 5 (five) minutes as needed for chest pain. 25 tablet 3  . rivaroxaban (XARELTO) 20 MG TABS tablet Take 1 tablet (20 mg total) by mouth daily with supper. 90 tablet 1  . zolpidem (AMBIEN) 10 MG tablet TAKE 1 TABLET BY MOUTH AT BEDTIME AS NEEDED FOR SLEEP (Patient taking differently: Take 5-10 mg by mouth at bedtime as needed for sleep. TAKE 1 TABLET BY MOUTH AT BEDTIME AS NEEDED FOR SLEEP) 15 tablet 3   No current facility-administered medications for this encounter.     No Known Allergies  Social History   Socioeconomic History  . Marital status: Married    Spouse name: Not on file  . Number of children: Not on file  . Years of education: Not on file  . Highest education level: Not on file  Occupational History  . Not on file  Social Needs  . Financial resource strain: Not on file  .  Food insecurity:    Worry: Not on file    Inability: Not on file  . Transportation needs:    Medical: Not on file    Non-medical: Not on file  Tobacco Use  . Smoking status: Never Smoker  . Smokeless tobacco: Never Used  Substance and Sexual Activity  . Alcohol use: Yes    Alcohol/week: 5.0 - 6.0 standard drinks    Types: 5 - 6 Glasses of wine per week  . Drug use: No  . Sexual activity: Not on file  Lifestyle  . Physical activity:    Days per week: Not on file    Minutes per session: Not on file  . Stress: Not on file  Relationships  . Social connections:    Talks on phone: Not on file    Gets together: Not on file    Attends  religious service: Not on file    Active member of club or organization: Not on file    Attends meetings of clubs or organizations: Not on file    Relationship status: Not on file  . Intimate partner violence:    Fear of current or ex partner: Not on file    Emotionally abused: Not on file    Physically abused: Not on file    Forced sexual activity: Not on file  Other Topics Concern  . Not on file  Social History Narrative  . Not on file    Family History  Problem Relation Age of Onset  . Heart disease Father 89       CAD  . Colon cancer Neg Hx   . Colon polyps Neg Hx   . Rectal cancer Neg Hx   . Stomach cancer Neg Hx   . Esophageal cancer Neg Hx     ROS- All systems are reviewed and negative except as per the HPI above  Physical Exam: Vitals:   04/05/18 0839  BP: 110/64  Pulse: 73  Weight: 76.6 kg  Height: 5\' 8"  (1.727 m)   Wt Readings from Last 3 Encounters:  04/05/18 76.6 kg  03/05/18 80.3 kg  02/11/18 76.7 kg    Labs: Lab Results  Component Value Date   NA 140 03/05/2018   K 4.5 03/05/2018   CL 107 03/05/2018   CO2 27 03/05/2018   GLUCOSE 91 03/05/2018   BUN 8 03/05/2018   CREATININE 0.80 03/05/2018   CALCIUM 9.5 03/05/2018   MG 2.2 06/19/2009   Lab Results  Component Value Date   INR 1.2 11/27/2016   Lab Results  Component Value Date   CHOL 168 08/15/2013   HDL 51.30 08/15/2013   LDLCALC 97 08/15/2013   TRIG 101.0 08/15/2013     GEN- The patient is well appearing, alert and oriented x 3 today.   Head- normocephalic, atraumatic Eyes-  Sclera clear, conjunctiva pink Ears- hearing intact Oropharynx- clear Neck- supple, no JVP Lymph- no cervical lymphadenopathy Lungs- Clear to ausculation bilaterally, normal work of breathing Heart- Regular rate and rhythm, no murmurs, rubs or gallops, PMI not laterally displaced GI- soft, NT, ND, + BS Extremities- no clubbing, cyanosis, or edema MS- no significant deformity or atrophy Skin- no rash or  lesion Psych- euthymic mood, full affect Neuro- strength and sensation are intact  EKG- NSR at 71 bpm, normal EKG, PR int 132 ms, qrs int 80 ms, qtc 430 ms Epic records reviewed    Assessment and Plan: 1. Persistent afib Doing very well after ablation  Staying in SR  2. Chadvasc score of 1 Continue xarelto without interruption  F/u with Dr. Curt Bears 10/29  Geroge Baseman. Corin Formisano, [facility] [address] [place], [place] [address] [phone]

## 2018-04-08 DIAGNOSIS — Z0000 Encounter for general adult medical examination without abnormal findings: Secondary | ICD-10-CM | POA: Diagnosis not present

## 2018-04-08 DIAGNOSIS — R82998 Other abnormal findings in urine: Secondary | ICD-10-CM | POA: Diagnosis not present

## 2018-04-10 DIAGNOSIS — D225 Melanocytic nevi of trunk: Secondary | ICD-10-CM | POA: Diagnosis not present

## 2018-04-10 DIAGNOSIS — L821 Other seborrheic keratosis: Secondary | ICD-10-CM | POA: Diagnosis not present

## 2018-04-10 DIAGNOSIS — L570 Actinic keratosis: Secondary | ICD-10-CM | POA: Diagnosis not present

## 2018-04-10 DIAGNOSIS — L565 Disseminated superficial actinic porokeratosis (DSAP): Secondary | ICD-10-CM | POA: Diagnosis not present

## 2018-04-11 DIAGNOSIS — M25511 Pain in right shoulder: Secondary | ICD-10-CM | POA: Diagnosis not present

## 2018-04-11 DIAGNOSIS — Z1389 Encounter for screening for other disorder: Secondary | ICD-10-CM | POA: Diagnosis not present

## 2018-04-11 DIAGNOSIS — M25512 Pain in left shoulder: Secondary | ICD-10-CM | POA: Diagnosis not present

## 2018-04-11 DIAGNOSIS — Z0000 Encounter for general adult medical examination without abnormal findings: Secondary | ICD-10-CM | POA: Diagnosis not present

## 2018-04-11 DIAGNOSIS — I4891 Unspecified atrial fibrillation: Secondary | ICD-10-CM | POA: Diagnosis not present

## 2018-04-11 DIAGNOSIS — Z23 Encounter for immunization: Secondary | ICD-10-CM | POA: Diagnosis not present

## 2018-04-27 ENCOUNTER — Other Ambulatory Visit: Payer: Self-pay | Admitting: Cardiovascular Disease

## 2018-05-06 ENCOUNTER — Ambulatory Visit: Payer: 59 | Admitting: Cardiovascular Disease

## 2018-06-05 NOTE — Progress Notes (Signed)
Electrophysiology Office Note Date: 06/14/2018  ID:  Travis Cook, DOB Jan 17, 1961, MRN [id number]  PCP: [name], MD Primary Cardiologist: [name] Electrophysiologist: Curt Bears  CC: AF ablation follow up  Travis Cook is a 57 y.o. male seen today for Dr Curt Bears.  He presents today for routine electrophysiology followup.  Since last being seen in our clinic, the patient reports doing very well.  He denies procedure related complications. No groin issues, no swallowing issues. He denies chest pain, palpitations, dyspnea, PND, orthopnea, nausea, vomiting, dizziness, syncope, edema, weight gain, or early satiety.  Past Medical History:  Diagnosis Date  . Anxiety   . CAD (coronary artery disease) 2010   a. s/p CABG  . HYPERLIPIDEMIA 08/29/2007  . NEPHROLITHIASIS, HX OF 08/29/2007  . Persistent atrial fibrillation    a. previously on amiodarone - discontinued 2/2 opthalmology concerns b. s/p PVI 2019 Dr Curt Bears   Past Surgical History:  Procedure Laterality Date  . ATRIAL FIBRILLATION ABLATION N/A 03/05/2018   Procedure: ATRIAL FIBRILLATION ABLATION;  Surgeon: [name], MD;  Location: [place] [facility];  Service: Cardiovascular;  Laterality: N/A;  . CARDIAC CATHETERIZATION    . CARDIOVERSION N/A 12/12/2016   Procedure: CARDIOVERSION;  Surgeon: [name], MD;  Location: [facility] ENDOSCOPY;  Service: Cardiovascular;  Laterality: N/A;  . CARDIOVERSION N/A 02/11/2018   Procedure: CARDIOVERSION;  Surgeon: [name], MD;  Location: [place];  Service: Cardiovascular;  Laterality: N/A;  . COLONOSCOPY    . CORONARY ARTERY BYPASS GRAFT     2010  . heart bypass     3 vessels 2010  . INGUINAL HERNIA REPAIR Left 01/16/2014   Procedure:  LEFT INGUINAL HERNIA REPAIR WITH ON-Q PUMP PLACEMENT;  Surgeon: [name], MD;  Location: [place];  Service: General;  Laterality: Left;  . INSERTION OF MESH Left 01/16/2014   Procedure: INSERTION OF MESH;  Surgeon: [name], MD;  Location: [place];  Service: General;  Laterality: Left;  . lithotripsy 2005    . POLYPECTOMY      Current Outpatient Medications  Medication Sig Dispense Refill  . aspirin EC 81 MG tablet Take 81 mg by mouth daily.    [name] atorvastatin (LIPITOR) 10 MG tablet Take 10 mg by mouth daily.    . Multiple Vitamins-Minerals (MULTIVITAMIN WITH MINERALS) tablet Take 1 tablet by mouth daily.    . nitroGLYCERIN (NITROSTAT) 0.4 MG SL tablet Place 1 tablet (0.4 mg total) under the tongue every 5 (five) minutes as needed for chest pain. 25 tablet 3  . zolpidem (AMBIEN) 10 MG tablet TAKE 1 TABLET BY MOUTH AT BEDTIME AS NEEDED FOR SLEEP 15 tablet 3   No current facility-administered medications for this visit.     Allergies:   Patient has no known allergies.   Social History: Social History   Socioeconomic History  . Marital status: Married    Spouse name: Not on file  . Number of children: Not on file  . Years of education: Not on file  . Highest education level: Not on file  Occupational History  . Not on file  Social Needs  . Financial resource strain: Not on file  . Food insecurity:    Worry: Not on file    Inability: Not on file  . Transportation needs:    Medical: Not on file    Non-medical: Not on file  Tobacco Use  . Smoking status: Never Smoker  . Smokeless tobacco: Never Used  Substance and Sexual Activity  . Alcohol use: Yes    Alcohol/week: 5.0 - 6.0 standard drinks    Types: 5 - 6 Glasses of wine per week  . Drug use: No  . Sexual activity: Not on file  Lifestyle  . Physical activity:    Days per week: Not on file    Minutes per session: Not on file  . Stress: Not on file  Relationships  . Social connections:    Talks on phone: Not on file    Gets together: Not on file    Attends religious service: Not on file    Active member of club or organization: Not on file    Attends meetings of clubs or organizations: Not on file    Relationship status: Not  on file  . Intimate partner violence:    Fear of current or ex partner: Not on file    Emotionally abused: Not on file    Physically abused: Not on file    Forced sexual activity: Not on file  Other Topics Concern  . Not on file  Social History Narrative  . Not on file    Family History: Family History  Problem Relation Age of Onset  . Heart disease Father 67       CAD  . Colon cancer Neg Hx   . Colon polyps Neg Hx   . Rectal cancer Neg Hx   . Stomach cancer Neg Hx   . Esophageal cancer Neg Hx     Review of Systems: All other systems reviewed and are otherwise negative except as noted above.   Physical Exam: VS:  BP 130/86   Pulse 77   Ht 5\' 8"  (1.727 m)   Wt 173 lb (78.5 kg)   SpO2 98%   BMI 26.30 kg/m  , BMI Body mass index is 26.3 kg/m. Wt Readings from Last 3 Encounters:  06/14/18 173 lb (78.5 kg)  04/05/18 168 lb 12.8 oz (76.6 kg)  03/05/18 177 lb 0.5 oz (80.3 kg)    GEN- The patient is well appearing, alert and oriented x 3 today.   HEENT: normocephalic, atraumatic; sclera clear, conjunctiva pink; hearing intact; oropharynx clear; neck supple Lungs- Clear to ausculation bilaterally, normal work of breathing.  No wheezes, rales, rhonchi Heart- Regular rate and rhythm  GI- soft, non-tender, non-distended, bowel sounds present  Extremities- no clubbing, cyanosis, or edema  MS- no significant deformity or atrophy Skin- warm and dry, no rash or lesion  Psych- euthymic mood, full affect Neuro- strength and sensation are intact   EKG:  EKG is ordered today. The ekg ordered today shows sinus rhythm, normal intervals  Recent Labs: 03/05/2018: BUN 8; Creatinine, Ser 0.80; Hemoglobin 14.7; Platelets 162; Potassium 4.5; Sodium 140    Other studies Reviewed: Additional studies/ records that were reviewed today include: office notes, hospital records  Assessment and Plan:  1.  Persistent atrial fibrillation Doing well with no recurrence post  ablation CHADS2VASC is 1 - will stop anticoagulation now that 3 months post ablation  2.  CAD s/p CABG No recent ischemic symptoms Continue ASA 81mg  daily    Current medicines are reviewed at length with the patient today.   The patient does not have concerns regarding his medicines.  The following changes were made today:  Stop Xarelto  Labs/ tests ordered today include: none Orders Placed This Encounter  Procedures  . EKG 12-Lead     Disposition:   Follow up with Dr Curt Bears 6  months, Dr [name] as scheduled     Signed, Chanetta Marshall, NP 06/14/2018 10:31 AM   [place] [address] [phone] (office) [phone] (fax)

## 2018-06-11 ENCOUNTER — Ambulatory Visit: Payer: 59 | Admitting: Cardiology

## 2018-06-13 ENCOUNTER — Encounter: Payer: Self-pay | Admitting: Nurse Practitioner

## 2018-06-13 DIAGNOSIS — G8929 Other chronic pain: Secondary | ICD-10-CM | POA: Diagnosis not present

## 2018-06-13 DIAGNOSIS — M19012 Primary osteoarthritis, left shoulder: Secondary | ICD-10-CM | POA: Diagnosis not present

## 2018-06-13 DIAGNOSIS — M7542 Impingement syndrome of left shoulder: Secondary | ICD-10-CM | POA: Diagnosis not present

## 2018-06-14 ENCOUNTER — Encounter: Payer: Self-pay | Admitting: Nurse Practitioner

## 2018-06-14 ENCOUNTER — Ambulatory Visit: Payer: 59 | Admitting: Nurse Practitioner

## 2018-06-14 VITALS — BP 130/86 | HR 77 | Ht 68.0 in | Wt 173.0 lb

## 2018-06-14 DIAGNOSIS — I4819 Other persistent atrial fibrillation: Secondary | ICD-10-CM | POA: Diagnosis not present

## 2018-06-14 DIAGNOSIS — I25810 Atherosclerosis of coronary artery bypass graft(s) without angina pectoris: Secondary | ICD-10-CM | POA: Diagnosis not present

## 2018-06-14 NOTE — Patient Instructions (Signed)
Medication Instructions:  STOP Xarelto If you need a refill on your cardiac medications before your next appointment, please call your pharmacy.   Lab work: none If you have labs (blood work) drawn today and your tests are completely normal, you will receive your results only by: [name] MyChart Message (if you have MyChart) OR . A paper copy in the mail If you have any lab test that is abnormal or we need to change your treatment, we will call you to review the results.  Testing/Procedures: none  Follow-Up: At [facility], you and your health needs are our priority.  As part of our continuing mission to provide you with exceptional heart care, we have created designated Provider Care Teams.  These Care Teams include your primary Cardiologist (physician) and Advanced Practice Providers (APPs -  Physician Assistants and Nurse Practitioners) who all work together to provide you with the care you need, when you need it. You will need a follow up appointment in 6 months.  Please call our office 2 months in advance to schedule this appointment.  You may see Dr Travis Cook or one of the following Advanced Practice Providers on your designated Care Team:   Travis Marshall, NP . Travis Standard, PA-C  Any Other Special Instructions Will Be Listed Below (If Applicable).

## 2018-06-17 ENCOUNTER — Other Ambulatory Visit: Payer: Self-pay | Admitting: Orthopedic Surgery

## 2018-06-17 DIAGNOSIS — R531 Weakness: Secondary | ICD-10-CM

## 2018-06-17 DIAGNOSIS — R52 Pain, unspecified: Secondary | ICD-10-CM

## 2018-07-08 ENCOUNTER — Ambulatory Visit
Admission: RE | Admit: 2018-07-08 | Discharge: 2018-07-08 | Disposition: A | Discharge status: Home / Self Care | Payer: 59 | Source: Ambulatory Visit | Attending: Orthopedic Surgery | Admitting: Orthopedic Surgery

## 2018-07-08 DIAGNOSIS — R531 Weakness: Secondary | ICD-10-CM

## 2018-07-08 DIAGNOSIS — M75102 Unspecified rotator cuff tear or rupture of left shoulder, not specified as traumatic: Secondary | ICD-10-CM | POA: Diagnosis not present

## 2018-07-08 DIAGNOSIS — R52 Pain, unspecified: Secondary | ICD-10-CM

## 2018-07-08 NOTE — Progress Notes (Signed)
Patient ID: Travis Cook, male   DOB: 1960/12/18, 57 y.o.   MRN: [id number]   57 y.o. CABG 06/2009 [name] to [name] [name] OM and [name] [name].with MV repair.  Developed PAF  11/03/16 CHA2DS2 1 [place] 12/12/16 reverted back 01/24/17 started on amiodarone with conversion. 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  All other systems reviewed and negative  General: BP 128/86   Pulse 77   Ht 5\' 8"  (1.727 m)   Wt 171 lb 4 oz (77.7 kg)   SpO2 97%   BMI 26.04 kg/m  Affect appropriate Healthy:  appears stated age 55: normal Neck supple with no adenopathy JVP normal no bruits no thyromegaly Lungs clear with no wheezing and good diaphragmatic motion Heart:  S1/S2 no murmur, no rub, gallop or click PMI normal post sternotomy  Abdomen: benighn, BS positve, no tenderness, no AAA no bruit.  No HSM or HJR Distal pulses intact with no bruits No edema Neuro non-focal Skin warm and dry No muscular weakness      Current Outpatient Medications  Medication Sig Dispense Refill  . aspirin EC 81 MG tablet Take 81 mg by mouth daily.    [name] atorvastatin (LIPITOR) 10  MG tablet Take 10 mg by mouth daily.    . cefdinir (OMNICEF) 300 MG capsule Take 300 mg by mouth 2 (two) times daily. X 7 days    . Multiple Vitamins-Minerals (MULTIVITAMIN WITH MINERALS) tablet Take 1 tablet by mouth daily.    . nitroGLYCERIN (NITROSTAT) 0.4 MG SL tablet Place 1 tablet (0.4 mg total) under the tongue every 5 (five) minutes as needed for chest pain. 25 tablet 3  . zolpidem (AMBIEN) 10 MG tablet TAKE 1 TABLET BY MOUTH AT BEDTIME AS NEEDED FOR SLEEP 15 tablet 3   No current facility-administered medications for this visit.     Allergies  Patient has no known allergies.  Electrocardiogram:  08/20/15 SR rate 60 LAE nonspecific ST T wave changes  11/03/16 Afib rate 86 PVC afib Is new compared to previous  afib rate 82  01/24/17  afib rate 72 nonspecific ST chagne s  01/28/18  afib rate 78 QT 382 no acute changes   Assessment and Plan  CAD/CABG:  2010  Stable no angina continue ASA/Statin  Baseline HR low so no beta blocker. Myovue 11/17/16 normal   Chol:  On statin labs with primary   Anxiety/Depression:  Still seems to have issues with stress  F/u primary consider SSRI  Afib:  CHA2VASC 1 post ablation July 2019 now off anticoagulation Previous use of amiodarone d/c due to keratopathy  Time spent reviewing chart and direct patient care 45 minutes    [name]

## 2018-07-12 DIAGNOSIS — Z6825 Body mass index (BMI) 25.0-25.9, adult: Secondary | ICD-10-CM | POA: Diagnosis not present

## 2018-07-12 DIAGNOSIS — J019 Acute sinusitis, unspecified: Secondary | ICD-10-CM | POA: Diagnosis not present

## 2018-07-15 ENCOUNTER — Ambulatory Visit: Payer: 59 | Admitting: Cardiovascular Disease

## 2018-07-15 ENCOUNTER — Encounter: Payer: Self-pay | Admitting: Cardiovascular Disease

## 2018-07-15 VITALS — BP 128/86 | HR 77 | Ht 68.0 in | Wt 171.2 lb

## 2018-07-15 DIAGNOSIS — I10 Essential (primary) hypertension: Secondary | ICD-10-CM

## 2018-07-15 DIAGNOSIS — I25810 Atherosclerosis of coronary artery bypass graft(s) without angina pectoris: Secondary | ICD-10-CM | POA: Diagnosis not present

## 2018-07-15 DIAGNOSIS — I480 Paroxysmal atrial fibrillation: Secondary | ICD-10-CM

## 2018-07-15 DIAGNOSIS — E785 Hyperlipidemia, unspecified: Secondary | ICD-10-CM

## 2018-07-15 NOTE — Patient Instructions (Signed)

## 2019-01-08 ENCOUNTER — Telehealth: Payer: Self-pay | Admitting: *Deleted

## 2019-01-08 NOTE — Telephone Encounter (Signed)
Calling patient today to discuss upcoming appointment.  We are currently trying to limit exposure to the virus that causes COVID-19 by seeing patients at home rather than in the office. We would like to schedule this appointment as a Virtual Appointment VIA Smartphone or Laptop. Unable to reach patient.  LVMTCB  

## 2019-01-16 NOTE — Telephone Encounter (Signed)

## 2019-01-17 ENCOUNTER — Other Ambulatory Visit: Payer: Self-pay

## 2019-01-17 ENCOUNTER — Ambulatory Visit: Payer: Managed Care, Other (non HMO) | Admitting: Cardiology

## 2019-01-17 ENCOUNTER — Telehealth (INDEPENDENT_AMBULATORY_CARE_PROVIDER_SITE_OTHER): Payer: Managed Care, Other (non HMO) | Admitting: Cardiology

## 2019-01-17 DIAGNOSIS — I4819 Other persistent atrial fibrillation: Secondary | ICD-10-CM | POA: Diagnosis not present

## 2019-01-17 NOTE — Progress Notes (Signed)
Electrophysiology TeleHealth Note   Due to national recommendations of social distancing due to COVID 19, an audio/video telehealth visit is felt to be most appropriate for this patient at this time.  See Epic message for the patient's consent to telehealth for [facility].   Date:  01/17/2019   ID:  Travis Cook, DOB [birth year], MRN [id number]  Location: patient's home  Provider location: [address], [place]  Evaluation Performed: Follow-up visit  PCP:  [name], MD  Cardiologist:  [name], MD  Electrophysiologist:  Dr Curt Bears  Chief Complaint:  AF  History of Present Illness:    Travis Cook is a 58 y.o. male who presents via audio/video conferencing for a telehealth visit today.  Since last being seen in our clinic, the patient reports doing very well.  Today, he denies symptoms of palpitations, chest pain, shortness of breath,  lower extremity edema, dizziness, presyncope, or syncope.  The patient is otherwise without complaint today.  The patient denies symptoms of fevers, chills, cough, or new SOB worrisome for COVID 19.  He has a history significant for persistent atrial fibrillation and coronary artery disease status post CABG.  He is status post atrial fibrillation ablation.  Today, denies symptoms of palpitations, chest pain, shortness of breath, orthopnea, PND, lower extremity edema, claudication, dizziness, presyncope, syncope, bleeding, or neurologic sequela. The patient is tolerating medications without difficulties.  Overall he is doing well.  He has no chest pain or shortness of breath.  He is noted no further episodes of atrial fibrillation.  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  Past Medical History:  Diagnosis Date  . Anxiety   . CAD (coronary artery disease) 2010   a. s/p CABG  . HYPERLIPIDEMIA 08/29/2007  .  NEPHROLITHIASIS, HX OF 08/29/2007  . Persistent atrial fibrillation    a. previously on amiodarone - discontinued 2/2 opthalmology concerns b. s/p PVI 2019 Dr Curt Bears    Past Surgical History:  Procedure Laterality Date  . ATRIAL FIBRILLATION ABLATION N/A 03/05/2018   Procedure: ATRIAL FIBRILLATION ABLATION;  Surgeon: [name], MD;  Location: [place] [facility];  Service: Cardiovascular;  Laterality: N/A;  . CARDIAC CATHETERIZATION    . CARDIOVERSION N/A 12/12/2016   Procedure: CARDIOVERSION;  Surgeon: [name], MD;  Location: [facility];  Service: Cardiovascular;  Laterality: N/A;  . CARDIOVERSION N/A 02/11/2018   Procedure: CARDIOVERSION;  Surgeon: [name], MD;  Location: [facility];  Service: Cardiovascular;  Laterality: N/A;  . COLONOSCOPY    . CORONARY ARTERY BYPASS GRAFT     2010  . heart bypass     3 vessels 2010  . INGUINAL HERNIA REPAIR Left 01/16/2014   Procedure:  LEFT INGUINAL HERNIA REPAIR WITH ON-Q PUMP PLACEMENT;  Surgeon: [name], MD;  Location: [place];  Service: General;  Laterality: Left;  . INSERTION OF MESH Left 01/16/2014   Procedure: INSERTION OF MESH;  Surgeon: [name], MD;  Location: [place];  Service: General;  Laterality: Left;  . lithotripsy 2005    . POLYPECTOMY      Current Outpatient Medications  Medication Sig Dispense Refill  . aspirin EC 81 MG tablet Take 81 mg by mouth daily.    [name] atorvastatin (LIPITOR) 10 MG tablet Take 10 mg by mouth daily.    . Multiple  Vitamins-Minerals (MULTIVITAMIN WITH MINERALS) tablet Take 1 tablet by mouth daily.    . nitroGLYCERIN (NITROSTAT) 0.4 MG SL tablet Place 1 tablet (0.4 mg total) under the tongue every 5 (five) minutes as needed for chest pain. 25 tablet 3  . zolpidem (AMBIEN) 10 MG tablet TAKE 1 TABLET BY MOUTH AT BEDTIME AS NEEDED FOR SLEEP 15 tablet 3  . cefdinir (OMNICEF) 300 MG capsule Take 300 mg by mouth 2 (two) times daily. X 7 days     No current facility-administered  medications for this visit.     Allergies:   Patient has no known allergies.   Social History:  The patient  reports that he has never smoked. He has never used smokeless tobacco. He reports current alcohol use of about 5.0 - 6.0 standard drinks of alcohol per week. He reports that he does not use drugs.   Family History:  The patient's  family history includes Heart disease (age of onset: 75) in his father.   ROS:  Please see the history of present illness.   All other systems are personally reviewed and negative.    Exam:    Vital Signs:  Pulse 79   Well appearing, alert and conversant, regular work of breathing,  good skin color Eyes- anicteric, neuro- grossly intact, skin- no apparent rash or lesions or cyanosis, mouth- oral mucosa is pink   Labs/Other Tests and Data Reviewed:    Recent Labs: 03/05/2018: BUN 8; Creatinine, Ser 0.80; Hemoglobin 14.7; Platelets 162; Potassium 4.5; Sodium 140   Wt Readings from Last 3 Encounters:  07/15/18 171 lb 4 oz (77.7 kg)  06/14/18 173 lb (78.5 kg)  04/05/18 168 lb 12.8 oz (76.6 kg)     Other studies personally reviewed: Additional studies/ records that were reviewed today include: ECG 06/14/2018 personally reviewed Review of the above records today demonstrates: Sinus rhythm   ASSESSMENT & PLAN:    1.  Persistent atrial fibrillation: Status post ablation 03/05/2018.  He is overall feeling well.  He is noted no further episodes of atrial fibrillation since ablation.  Currently not anticoagulated.  No changes.  This patients CHA2DS2-VASc Score and unadjusted Ischemic Stroke Rate (% per year) is equal to 0.6 % stroke rate/year from a score of 1  Above score calculated as 1 point each if present [CHF, HTN, DM, Vascular=MI/PAD/Aortic Plaque, Age if 65-74, or Male] Above score calculated as 2 points each if present [Age > 75, or Stroke/TIA/TE]  2.  Coronary artery disease status post CABG: No current chest pain   COVID 19 screen The  patient denies symptoms of COVID 19 at this time.  The importance of social distancing was discussed today.  Follow-up: 6 months  Current medicines are reviewed at length with the patient today.   The patient does not have concerns regarding his medicines.  The following changes were made today:  none  Labs/ tests ordered today include:  No orders of the defined types were placed in this encounter.    Patient Risk:  after full review of this patients clinical status, I feel that they are at moderate risk at this time.  Today, I have spent 9 minutes with the patient with telehealth technology discussing atrial fibrillation.    Signed, Zelina Jimerson Meredith Leeds, MD  01/17/2019 10:00 AM     [facility] [address] [place] [address] [phone] (office) [phone] (fax)

## 2019-03-12 ENCOUNTER — Other Ambulatory Visit: Payer: Self-pay

## 2019-03-12 DIAGNOSIS — Z20822 Contact with and (suspected) exposure to covid-19: Secondary | ICD-10-CM

## 2019-03-14 LAB — NOVEL CORONAVIRUS, NAA: SARS-CoV-2, NAA: NOT DETECTED

## 2019-03-19 ENCOUNTER — Other Ambulatory Visit: Payer: Self-pay

## 2019-03-19 DIAGNOSIS — Z20822 Contact with and (suspected) exposure to covid-19: Secondary | ICD-10-CM

## 2019-03-20 LAB — NOVEL CORONAVIRUS, NAA: SARS-CoV-2, NAA: NOT DETECTED

## 2019-06-04 ENCOUNTER — Other Ambulatory Visit: Payer: Self-pay

## 2019-06-04 DIAGNOSIS — Z20822 Contact with and (suspected) exposure to covid-19: Secondary | ICD-10-CM

## 2019-06-05 LAB — NOVEL CORONAVIRUS, NAA: SARS-CoV-2, NAA: NOT DETECTED

## 2019-08-06 ENCOUNTER — Ambulatory Visit: Payer: Managed Care, Other (non HMO) | Admitting: Cardiology

## 2019-08-20 NOTE — Progress Notes (Signed)
Patient ID: Travis Cook, male   DOB: 07/05/1961, 59 y.o.   MRN: [id number]   59 y.o. CABG 06/2009 [name] [name] to [name] [name] OM and [name] [name].with MV repair.  Developed PAF  11/03/16 CHA2DS2 1 [place] 12/12/16 reverted back 01/24/17 started on amiodarone with conversion. 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  All other systems reviewed and negative  General: There were no vitals taken for this visit. Affect appropriate Healthy:  appears stated age 1: normal Neck supple with no adenopathy JVP normal no bruits no thyromegaly Lungs clear with no wheezing and good diaphragmatic motion Heart:  S1/S2 no murmur, no rub, gallop or click PMI normal post sternotomy  Abdomen: benighn, BS positve, no tenderness, no AAA no bruit.  No HSM or HJR Distal pulses intact with no bruits No edema Neuro non-focal Skin  warm and dry No muscular weakness      Current Outpatient Medications  Medication Sig Dispense Refill  . aspirin EC 81 MG tablet Take 81 mg by mouth daily.    [name] atorvastatin (LIPITOR) 10 MG tablet Take 10 mg by mouth daily.    . cefdinir (OMNICEF) 300 MG capsule Take 300 mg by mouth 2 (two) times daily. X 7 days    . Multiple Vitamins-Minerals (MULTIVITAMIN WITH MINERALS) tablet Take 1 tablet by mouth daily.    . nitroGLYCERIN (NITROSTAT) 0.4 MG SL tablet Place 1 tablet (0.4 mg total) under the tongue every 5 (five) minutes as needed for chest pain. 25 tablet 3  . zolpidem (AMBIEN) 10 MG tablet TAKE 1 TABLET BY MOUTH AT BEDTIME AS NEEDED FOR SLEEP 15 tablet 3   No current facility-administered medications for this visit.    Allergies  Patient has no known allergies.  Electrocardiogram:   06/14/18 SR rate 77 nonspecific ST changes 09/02/2019 SR rate 73 normal   Assessment and Plan  CAD/CABG:  2010  Stable no angina continue ASA/Statin  Baseline HR low so no beta blocker. Myovue 11/17/16 normal   Chol:  On statin labs with primary   Anxiety/Depression:  Still seems to have issues with stress  F/u primary consider SSRI  Afib:  CHA2VASC 1 post ablation July 2019 now off anticoagulation Previous use of amiodarone d/c due to keratopathy Seen by [facility] 01/17/19 in NSR stable  Time spent reviewing chart and direct patient care 30 minutes  F/U in 6 months   [name]

## 2019-09-02 ENCOUNTER — Ambulatory Visit: Payer: Managed Care, Other (non HMO) | Admitting: Cardiovascular Disease

## 2019-09-02 ENCOUNTER — Other Ambulatory Visit: Payer: Self-pay

## 2019-09-02 ENCOUNTER — Encounter: Payer: Self-pay | Admitting: Cardiovascular Disease

## 2019-09-02 VITALS — BP 116/76 | HR 76 | Ht 68.5 in | Wt 167.8 lb

## 2019-09-02 DIAGNOSIS — I25810 Atherosclerosis of coronary artery bypass graft(s) without angina pectoris: Secondary | ICD-10-CM

## 2019-09-02 NOTE — Patient Instructions (Signed)

## 2019-09-03 ENCOUNTER — Other Ambulatory Visit: Payer: Self-pay | Admitting: Cardiovascular Disease

## 2020-02-27 NOTE — Progress Notes (Deleted)
Patient ID: Travis Cook, male   DOB: 1961-06-04, 59 y.o.   MRN: [id number]     59 y.o. CABG 06/2009 [name] [name] to [name] [name] OM and [name] [name].with MV repair.  Developed PAF  11/03/16 CHA2DS2 1 [place] 12/12/16 reverted back 01/24/17 started on amiodarone with conversion. 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  All other systems reviewed and negative  General: There were no vitals taken for this visit. Affect appropriate Healthy:  appears stated age 19: normal Neck supple with no adenopathy JVP normal no bruits no thyromegaly Lungs clear with no wheezing and good diaphragmatic motion Heart:  S1/S2 no murmur, no rub, gallop or click PMI normal post sternotomy  Abdomen: benighn, BS positve, no tenderness, no AAA no bruit.  No HSM or HJR Distal pulses intact with no bruits No edema Neuro non-focal Skin warm and dry No muscular weakness      Current Outpatient Medications    Medication Sig Dispense Refill  . aspirin EC 81 MG tablet Take 81 mg by mouth daily.    [name] atorvastatin (LIPITOR) 10 MG tablet Take 10 mg by mouth daily.    . Multiple Vitamins-Minerals (MULTIVITAMIN WITH MINERALS) tablet Take 1 tablet by mouth daily.    . nitroGLYCERIN (NITROSTAT) 0.4 MG SL tablet PLACE 1 TABLET (0.4 MG TOTAL) UNDER THE TONGUE EVERY 5 (FIVE) MINUTES AS NEEDED FOR CHEST PAIN. 25 tablet 3  . zolpidem (AMBIEN) 10 MG tablet TAKE 1 TABLET BY MOUTH AT BEDTIME AS NEEDED FOR SLEEP 15 tablet 3   No current facility-administered medications for this visit.    Allergies  Patient has no known allergies.  Electrocardiogram:   06/14/18 SR rate 77 nonspecific ST changes 09/02/2019 SR rate 73 normal   Assessment and Plan  CAD/CABG:  2010  Stable no angina continue ASA/Statin  Baseline HR low so no beta blocker. Myovue 11/17/16 normal   Chol:  On statin labs with primary   Anxiety/Depression:  Still seems to have issues with stress  F/u primary consider SSRI  Afib:  CHA2VASC 1 post ablation July 2019 now off anticoagulation Previous use of amiodarone d/c due to keratopathy Seen by [facility] 01/17/19 stable    F/U in  A year  [name]

## 2020-03-11 ENCOUNTER — Ambulatory Visit: Payer: Managed Care, Other (non HMO) | Admitting: Cardiovascular Disease

## 2020-04-30 NOTE — Progress Notes (Signed)
Patient ID: Travis Cook, male   DOB: Aug 14, 1961, 59 y.o.   MRN: [id number]     59 y.o. CABG 06/2009 [name] [name] to [name] [name] OM and [name] [name].with MV repair.  Developed PAF  11/03/16 CHA2DS2 1 [place] 12/12/16 reverted back 01/24/17 started on amiodarone with conversion. 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  All other systems reviewed and negative  General: There were no vitals taken for this visit. Affect appropriate Healthy:  appears stated age 59: normal Neck supple with no adenopathy JVP normal no bruits no thyromegaly Lungs clear with no wheezing and good diaphragmatic motion Heart:  S1/S2 no murmur, no rub, gallop or click PMI normal post sternotomy  Abdomen: benighn, BS positve, no tenderness, no AAA no bruit.  No HSM or HJR Distal pulses intact with no bruits No edema Neuro non-focal Skin warm and dry No muscular weakness   Current Outpatient Medications  Medication Sig Dispense Refill  . aspirin EC 81 MG tablet Take  81 mg by mouth daily.    [name] atorvastatin (LIPITOR) 10 MG tablet Take 10 mg by mouth daily.    . Multiple Vitamins-Minerals (MULTIVITAMIN WITH MINERALS) tablet Take 1 tablet by mouth daily.    . nitroGLYCERIN (NITROSTAT) 0.4 MG SL tablet PLACE 1 TABLET (0.4 MG TOTAL) UNDER THE TONGUE EVERY 5 (FIVE) MINUTES AS NEEDED FOR CHEST PAIN. 25 tablet 3  . zolpidem (AMBIEN) 10 MG tablet TAKE 1 TABLET BY MOUTH AT BEDTIME AS NEEDED FOR SLEEP 15 tablet 3   No current facility-administered medications for this visit.    Allergies  Patient has no known allergies.  Electrocardiogram:   06/14/18 SR rate 77 nonspecific ST changes 09/02/2019 SR rate 73 normal   Assessment and Plan  CAD/CABG:  2010  Stable no angina continue ASA/Statin  Baseline HR low so no beta blocker. Myovue 11/17/16 normal   Chol:  On statin labs with primary   Anxiety/Depression:  Still seems to have issues with stress  F/u primary consider SSRI  Afib:  CHA2VASC 1 post ablation July 2019 now off anticoagulation Previous use of amiodarone d/c due to keratopathy Seen by [facility] 01/17/19 in NSR stable   MV Repair:  Echo 11/17/16 with trivial residual MR SBE prophylaxis stable    Time spent reviewing chart and direct patient care 30 minutes  F/U in 6 months   Travis Cook

## 2020-05-14 ENCOUNTER — Other Ambulatory Visit: Payer: Self-pay

## 2020-05-14 ENCOUNTER — Encounter: Payer: Self-pay | Admitting: Cardiovascular Disease

## 2020-05-14 ENCOUNTER — Ambulatory Visit (INDEPENDENT_AMBULATORY_CARE_PROVIDER_SITE_OTHER): Payer: Managed Care, Other (non HMO) | Admitting: Cardiovascular Disease

## 2020-05-14 VITALS — BP 116/78 | HR 61 | Ht 68.5 in | Wt 174.4 lb

## 2020-05-14 DIAGNOSIS — Z9889 Other specified postprocedural states: Secondary | ICD-10-CM | POA: Diagnosis not present

## 2020-05-14 NOTE — Patient Instructions (Signed)

## 2020-05-26 ENCOUNTER — Telehealth: Payer: Self-pay

## 2020-05-26 NOTE — Telephone Encounter (Signed)
NOTES ON FILE FROM [facility] , SENT REFERRAL TO SCEDULING

## 2021-10-06 ENCOUNTER — Encounter: Payer: Self-pay | Admitting: Cardiovascular Disease

## 2021-10-06 DIAGNOSIS — I4891 Unspecified atrial fibrillation: Secondary | ICD-10-CM

## 2021-10-07 NOTE — Telephone Encounter (Signed)
Called patient about his message. Made patient an appointment with A. FIB clinic for this coming Monday. Patient stated he woke up this morning and he thinks he is back in NSR. Patient stated as soon as he felt like he was in A. FIB he started taking his xarelto again. Dr. [name] recommends patient continue taking his xarelto until he sees Dr. Curt Bears. He also recommends patient to have a 30 day event monitor. Have sent message to patient on plans through mychart. Will contact Dr. Curt Bears scheduler to get patient in to see him.

## 2021-10-10 ENCOUNTER — Ambulatory Visit (HOSPITAL_COMMUNITY)
Admission: RE | Admit: 2021-10-10 | Discharge: 2021-10-10 | Disposition: A | Discharge status: Home / Self Care | Payer: Managed Care, Other (non HMO) | Source: Ambulatory Visit | Attending: Physician Assistant | Admitting: Physician Assistant

## 2021-10-10 ENCOUNTER — Other Ambulatory Visit: Payer: Self-pay

## 2021-10-10 VITALS — BP 116/76 | HR 69 | Ht 68.5 in | Wt 173.8 lb

## 2021-10-10 DIAGNOSIS — E785 Hyperlipidemia, unspecified: Secondary | ICD-10-CM | POA: Insufficient documentation

## 2021-10-10 DIAGNOSIS — I4819 Other persistent atrial fibrillation: Secondary | ICD-10-CM | POA: Diagnosis not present

## 2021-10-10 DIAGNOSIS — Z951 Presence of aortocoronary bypass graft: Secondary | ICD-10-CM | POA: Diagnosis not present

## 2021-10-10 DIAGNOSIS — R9431 Abnormal electrocardiogram [ECG] [EKG]: Secondary | ICD-10-CM | POA: Insufficient documentation

## 2021-10-10 DIAGNOSIS — I2510 Atherosclerotic heart disease of native coronary artery without angina pectoris: Secondary | ICD-10-CM | POA: Insufficient documentation

## 2021-10-10 MED ORDER — DILTIAZEM HCL 30 MG PO TABS: ORAL_TABLET | ORAL | 1 refills | Status: AC

## 2021-10-10 NOTE — Progress Notes (Signed)
Primary Care Physician: [name], MD Primary Cardiologist: Dr [name] Primary Electrophysiologist: Dr Curt Bears Referring Physician: Dr Olena Heckle is a 61 y.o. male with a history of CAD s/p CABG, HLD, atrial fibrillation who presents for follow up in the [facility]. Patient is s/p afib ablation with Dr Curt Bears on 03/05/18. He was previously on amiodarone but this was discontinued due to ophthalmic side effects. Patient has a CHADS2VASC score of 1. Patient sent a message to primary cardiologist 10/06/21 reporting afib on his smart watch. His Xarelto was resumed. An event monitor was also ordered. Patient converted to SR on 10/07/21 and has remained on SR over the weekend. He stopped Xarelto. Long travel days and drinking cold fluids have been triggers for him in the past.   Today, he denies symptoms of palpitations, chest pain, shortness of breath, orthopnea, PND, lower extremity edema, dizziness, presyncope, syncope, snoring, daytime somnolence, bleeding, or neurologic sequela. The patient is tolerating medications without difficulties and is otherwise without complaint today.    Atrial Fibrillation Risk Factors:  he does not have symptoms or diagnosis of sleep apnea. he does not have a history of rheumatic fever.   he has a BMI of Body mass index is 26.04 kg/m.[name] Filed Weights   10/10/21 1046  Weight: 78.8 kg    Family History  Problem Relation Age of Onset   Heart disease Father 21       CAD   Colon cancer Neg Hx    Colon polyps Neg Hx    Rectal cancer Neg Hx    Stomach cancer Neg Hx    Esophageal cancer Neg Hx      Atrial Fibrillation Management history:  Previous antiarrhythmic drugs: amiodarone (d/c for ophthalmic side effects) Previous cardioversions: 2018, 2019 Previous ablations: 03/05/18 CHADS2VASC score: 1 Anticoagulation history: Xarelto    Past Medical History:  Diagnosis Date   Anxiety    CAD (coronary artery  disease) 2010   a. s/p CABG   HYPERLIPIDEMIA 08/29/2007   NEPHROLITHIASIS, HX OF 08/29/2007   Persistent atrial fibrillation (HCC)    a. previously on amiodarone - discontinued 2/2 opthalmology concerns b. s/p PVI 2019 Dr Curt Bears   Past Surgical History:  Procedure Laterality Date   ATRIAL FIBRILLATION ABLATION N/A 03/05/2018   Procedure: ATRIAL FIBRILLATION ABLATION;  Surgeon: [name], MD;  Location: [facility];  Service: Cardiovascular;  Laterality: N/A;   CARDIAC CATHETERIZATION     CARDIOVERSION N/A 12/12/2016   Procedure: CARDIOVERSION;  Surgeon: [name], MD;  Location: [facility];  Service: Cardiovascular;  Laterality: N/A;   CARDIOVERSION N/A 02/11/2018   Procedure: CARDIOVERSION;  Surgeon: [name], MD;  Location: [place];  Service: Cardiovascular;  Laterality: N/A;   COLONOSCOPY     CORONARY ARTERY BYPASS GRAFT     2010   heart bypass     3 vessels 2010   INGUINAL HERNIA REPAIR Left 01/16/2014   Procedure:  LEFT INGUINAL HERNIA REPAIR WITH ON-Q PUMP PLACEMENT;  Surgeon: [name], MD;  Location: [place];  Service: General;  Laterality: Left;   INSERTION OF MESH Left 01/16/2014   Procedure: INSERTION OF MESH;  Surgeon: [name], MD;  Location: [facility];  Service: General;  Laterality: Left;   lithotripsy 2005     POLYPECTOMY      Current Outpatient Medications  Medication Sig Dispense Refill   aspirin EC 81 MG tablet Take 81 mg by  mouth daily.     atorvastatin (LIPITOR) 10 MG tablet Take 10 mg by mouth daily.     diltiazem (CARDIZEM) 30 MG tablet Take 1 tablet every 4 hours AS NEEDED for heart rate >100 30 tablet 1   Multiple Vitamins-Minerals (MULTIVITAMIN WITH MINERALS) tablet Take 1 tablet by mouth daily.     nitroGLYCERIN (NITROSTAT) 0.4 MG SL tablet PLACE 1 TABLET (0.4 MG TOTAL) UNDER THE TONGUE EVERY 5 (FIVE) MINUTES AS NEEDED FOR CHEST PAIN. 25 tablet 3   zolpidem (AMBIEN) 10 MG tablet TAKE 1 TABLET BY MOUTH AT BEDTIME AS  NEEDED FOR SLEEP 15 tablet 3   No current facility-administered medications for this encounter.    No Known Allergies  Social History   Socioeconomic History   Marital status: Married    Spouse name: Not on file   Number of children: Not on file   Years of education: Not on file   Highest education level: Not on file  Occupational History   Not on file  Tobacco Use   Smoking status: Never   Smokeless tobacco: Never  Vaping Use   Vaping Use: Never used  Substance and Sexual Activity   Alcohol use: Yes    Alcohol/week: 5.0 - 6.0 standard drinks    Types: 5 - 6 Glasses of wine per week   Drug use: No   Sexual activity: Not on file  Other Topics Concern   Not on file  Social History Narrative   Not on file   Social Determinants of Health   Financial Resource Strain: Not on file  Food Insecurity: Not on file  Transportation Needs: Not on file  Physical Activity: Not on file  Stress: Not on file  Social Connections: Not on file  Intimate Partner Violence: Not on file     ROS- All systems are reviewed and negative except as per the HPI above.  Physical Exam: Vitals:   10/10/21 1046  BP: 116/76  Pulse: 69  Weight: 78.8 kg  Height: 5' 8.5" (1.74 m)    GEN- The patient is a well appearing male, alert and oriented x 3 today.   Head- normocephalic, atraumatic Eyes-  Sclera clear, conjunctiva pink Ears- hearing intact Oropharynx- clear Neck- supple  Lungs- Clear to ausculation bilaterally, normal work of breathing Heart- Regular rate and rhythm, no murmurs, rubs or gallops  GI- soft, NT, ND, + BS Extremities- no clubbing, cyanosis, or edema MS- no significant deformity or atrophy Skin- no rash or lesion Psych- euthymic mood, full affect Neuro- strength and sensation are intact  Wt Readings from Last 3 Encounters:  10/10/21 78.8 kg  05/14/20 79.1 kg  09/02/19 76.1 kg    EKG today demonstrates  SR Vent. rate 69 BPM PR interval 148 ms QRS duration 90  ms QT/QTcB 396/424 ms  Echo 11/17/16 demonstrated  - Left ventricle: The cavity size was normal. Wall thickness was    increased in a pattern of mild LVH. Systolic function was normal.    The estimated ejection fraction was in the range of 60% to 65%.    Wall motion was normal; there were no regional wall motion    abnormalities. The study is not technically sufficient to allow    evaluation of LV diastolic function.  - Mitral valve: Prior mitral valve repair. Mildly thickened    leaflets . No stenosis. There was trivial regurgitation.  - Left atrium: The atrium was normal in size.  - Tricuspid valve: There was mild regurgitation.  -  Pulmonary arteries: PA peak pressure: 23 mm Hg (S).  - Inferior vena cava: The vessel was normal in size. The    respirophasic diameter changes were in the normal range (= 50%),    consistent with normal central venous pressure.   Impressions:   - LVEF 60-65%, mild LVH, normal wall motion, stable mitral valve    repair, trivial MR, normal biatrial size, mild TR, RVSP 23 mmHg,    normal IVC.   Epic records are reviewed at length today  CHA2DS2-VASc Score = 1  The patient's score is based upon: CHF History: 0 HTN History: 0 Diabetes History: 0 Stroke History: 0 Vascular Disease History: 1 Age Score: 0 Gender Score: 0       ASSESSMENT AND PLAN: 1. Persistent Atrial Fibrillation (ICD10:  I48.19) The patient's CHA2DS2-VASc score is 1, indicating a 0.6% annual risk of stroke.   S/p afib ablation 03/05/18 Patient stopped Xarelto after converting to SR. Will see his arrhythmia burden on event monitor. If he has frequent afib, can consider Multaq or dofetilide or repeat ablation. We briefly discussed theses options today. Start diltiazem 30 mg PRN q 4 hours for heart racing.   2. CAD  S/p CABG No anginal symptoms.   Follow up with Dr [name] and Dr Curt Bears as scheduled.    [facility] [address] [place], [place] [address] [phone] 10/10/2021 11:25 AM

## 2021-10-10 NOTE — Patient Instructions (Signed)
Cardizem 30mg -- take 1 tablet every 4 hours AS NEEDED for heart rate >100 as long as top number of blood pressure >100.  

## 2021-10-18 ENCOUNTER — Other Ambulatory Visit (HOSPITAL_COMMUNITY): Payer: Self-pay | Admitting: Cardiovascular Disease

## 2021-10-18 DIAGNOSIS — I4891 Unspecified atrial fibrillation: Secondary | ICD-10-CM

## 2021-10-18 NOTE — Progress Notes (Incomplete)
Patient ID: Travis Cook, male   DOB: [birth year], 61 y.o.   MRN: [id number]     61 y.o. CABG 06/2009 [name] [name] to [name] [name] OM and [name] [name].with MV repair.  Developed PAF  11/03/16 CHA2DS2 1 [place] 12/12/16 reverted back 01/24/17 started on amiodarone with conversion. 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  ***     ROS: Denies fever, malais, weight loss, blurry vision, decreased visual acuity, cough, sputum, SOB, hemoptysis, pleuritic pain, palpitaitons, heartburn, abdominal pain, melena, lower extremity edema, claudication, or rash.  All other systems reviewed and negative  General: There were no vitals taken for this visit. Affect appropriate Healthy:  appears stated age 66: normal Neck supple with no adenopathy JVP normal no bruits no thyromegaly Lungs clear with no wheezing and good diaphragmatic motion Heart:  S1/S2 no murmur, no rub, gallop or click PMI normal post sternotomy  Abdomen: benighn, BS positve, no tenderness, no AAA no bruit.  No HSM or HJR Distal pulses intact with no bruits No edema Neuro non-focal Skin warm and dry No muscular weakness   Current Outpatient Medications  Medication Sig Dispense Refill   aspirin EC 81 MG tablet Take  81 mg by mouth daily.     atorvastatin (LIPITOR) 10 MG tablet Take 10 mg by mouth daily.     diltiazem (CARDIZEM) 30 MG tablet Take 1 tablet every 4 hours AS NEEDED for heart rate >100 30 tablet 1   Multiple Vitamins-Minerals (MULTIVITAMIN WITH MINERALS) tablet Take 1 tablet by mouth daily.     nitroGLYCERIN (NITROSTAT) 0.4 MG SL tablet PLACE 1 TABLET (0.4 MG TOTAL) UNDER THE TONGUE EVERY 5 (FIVE) MINUTES AS NEEDED FOR CHEST PAIN. 25 tablet 3   zolpidem (AMBIEN) 10 MG tablet TAKE 1 TABLET BY MOUTH AT BEDTIME AS NEEDED FOR SLEEP 15 tablet 3   No current facility-administered medications for this visit.    Allergies  Patient has no known allergies.  Electrocardiogram:   06/14/18 SR rate 77 nonspecific ST changes 09/02/2019 SR rate 73 normal   Assessment and Plan  CAD/CABG:  2010  Stable no angina continue ASA/Statin  Baseline HR low so no beta blocker. 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

## 2021-10-27 ENCOUNTER — Ambulatory Visit (HOSPITAL_COMMUNITY): Payer: Managed Care, Other (non HMO) | Attending: Cardiology

## 2021-10-27 ENCOUNTER — Other Ambulatory Visit: Payer: Self-pay

## 2021-10-27 DIAGNOSIS — I4891 Unspecified atrial fibrillation: Secondary | ICD-10-CM | POA: Diagnosis present

## 2021-10-29 LAB — ECHOCARDIOGRAM COMPLETE
Area-P 1/2: 5.27 cm2
S' Lateral: 2.2 cm

## 2021-10-30 ENCOUNTER — Telehealth: Payer: Self-pay | Admitting: Physician Assistant

## 2021-10-30 ENCOUNTER — Ambulatory Visit (INDEPENDENT_AMBULATORY_CARE_PROVIDER_SITE_OTHER): Payer: Managed Care, Other (non HMO)

## 2021-10-30 DIAGNOSIS — I4891 Unspecified atrial fibrillation: Secondary | ICD-10-CM | POA: Diagnosis not present

## 2021-10-30 NOTE — Telephone Encounter (Signed)
Call received from [employer]: ?Pt had baseline recording of Afib 100-90 - 11:12 central. Pt was dizzy - manually recorded. Pt is still in afib  now.  ? ? ?I confirmed strips will be faxed to Afib clinic. I have not restarted [place]. I will send a message to C. Fenton to call patient tomorrow and decide about [place] given low CHADS2VASC score of 1.  ?

## 2021-10-31 ENCOUNTER — Telehealth: Payer: Self-pay

## 2021-10-31 NOTE — Telephone Encounter (Signed)
? ?  Cardiac Monitor Alert ? ?Date of alert:  10/31/2021  ? ?Patient Name: Travis Cook  ?DOB: 09-Jul-1961  ?MRN: [id number]  ? ?[facility] Cardiologist: [name], MD  ?[facility] EP:  [name], MD   ? ?Monitor Information: ?Cardiac Event Monitor [[employer]]  ?Reason:  Atrial Fib ?Ordering provider:  [name] ?  ?Alert ?Atrial Fibrillation/Flutter ?This is the 1st alert for this rhythm.  ?The patient has a hx of Atrial Fibrillation/Flutter.  ?The patient is not currently on anticoagulation.  He states he did take a Xarelto '20mg'$  on Saturday d/t being in Afib. ? ?Next Cardiology Appointment   ?Date:  11/01/2021  Provider:  Dr [name] ? ?The patient was contacted today.  He is asymptomatic. ?Arrhythmia, symptoms and history reviewed with Dr Irish Lack.  Plan:  Continue monitoring   and keep appointment with Dr [name] 11/01/2021. ? ?Other: ?Spoke with pt who reports he went back to NSR yesterday afternoon. ? ?Thora Lance, RN  ?10/31/2021 2:19 PM   ?

## 2021-10-31 NOTE — Telephone Encounter (Signed)
The patient was contacted today.  He is asymptomatic. ?Arrhythmia, symptoms and history reviewed with Dr Irish Lack.  Plan:  Continue monitoring   and keep appointment with Dr [name] 11/01/2021. ?  ?Other: ?Spoke with pt who reports he went back to NSR yesterday afternoon. ?

## 2021-11-01 ENCOUNTER — Ambulatory Visit: Payer: Managed Care, Other (non HMO) | Admitting: Cardiovascular Disease

## 2021-11-01 ENCOUNTER — Other Ambulatory Visit: Payer: Self-pay

## 2021-11-01 ENCOUNTER — Encounter: Payer: Self-pay | Admitting: Cardiovascular Disease

## 2021-11-01 VITALS — BP 100/72 | HR 66 | Ht 68.5 in | Wt 171.0 lb

## 2021-11-01 DIAGNOSIS — I4891 Unspecified atrial fibrillation: Secondary | ICD-10-CM | POA: Diagnosis not present

## 2021-11-01 DIAGNOSIS — Z9889 Other specified postprocedural states: Secondary | ICD-10-CM

## 2021-11-01 DIAGNOSIS — I4819 Other persistent atrial fibrillation: Secondary | ICD-10-CM

## 2021-11-01 MED ORDER — RIVAROXABAN 20 MG PO TABS: 20.0000 mg | ORAL_TABLET | Freq: Every day | ORAL | 11 refills | Status: DC

## 2021-11-01 MED ORDER — METOPROLOL TARTRATE 25 MG PO TABS: 25.0000 mg | ORAL_TABLET | ORAL | 3 refills | Status: AC | PRN

## 2021-11-01 NOTE — Patient Instructions (Addendum)
Medication Instructions:  ?Your physician has recommended you make the following change in your medication:  ?1-TAKE metoprolol 25 mg by mouth as needed for palpitations. ? ?*If you need a refill on your cardiac medications before your next appointment, please call your pharmacy* ? ?Lab Work: ?If you have labs (blood work) drawn today and your tests are completely normal, you will receive your results only by: ?MyChart Message (if you have MyChart) OR ?A paper copy in the mail ?If you have any lab test that is abnormal or we need to change your treatment, we will call you to review the results. ? ?Testing/Procedures: ?None ordered today. ? ?Follow-Up: ?At [facility], you and your health needs are our priority.  As part of our continuing mission to provide you with exceptional heart care, we have created designated Provider Care Teams.  These Care Teams include your primary Cardiologist (physician) and Advanced Practice Providers (APPs -  Physician Assistants and Nurse Practitioners) who all work together to provide you with the care you need, when you need it. ? ?We recommend signing up for the patient portal called "MyChart".  Sign up information is provided on this After Visit Summary.  MyChart is used to connect with patients for Virtual Visits (Telemedicine).  Patients are able to view lab/test results, encounter notes, upcoming appointments, etc.  Non-urgent messages can be sent to your provider as well.   ?To learn more about what you can do with MyChart, go to [email or web address].   ? ?Your next appointment:   ?6 month(s) ? ?The format for your next appointment:   ?In Person ? ?Provider:   ?[name], MD { ? ? ?

## 2021-11-07 ENCOUNTER — Telehealth: Payer: Self-pay

## 2021-11-07 NOTE — Telephone Encounter (Signed)
OZDGUY:[id number];ZDGLOV:[id number];Review Type:Prior Auth;Coverage Start Date:10/08/2021;Coverage End Date:11/07/2022 ? ?Xarelto 20 mg tablets ?

## 2021-11-21 ENCOUNTER — Telehealth: Payer: Self-pay | Admitting: Cardiology

## 2021-11-21 NOTE — Telephone Encounter (Signed)
Incoming call from [employer] to clarify diagnosis code on event monitor for patient. Per encounter on 10/07/21, cardiac event monitor for A-fib ordered by Dr [name] for unspecified A-fib (I48.91). Pt seen in A-fib clinic on 10/10/21 and Clint Fenton has below diagnosis listed for patient: ? ?Persistent atrial fibrillation (HCC)Noted 03/05/2018 ?[I48.19] ? ?Informed Benjie Karvonen of this diagnosis and associated code.  ?

## 2021-11-21 NOTE — Telephone Encounter (Signed)
Calling to get dx code for the monitor. Please advise  ?

## 2021-11-28 ENCOUNTER — Telehealth: Payer: Self-pay | Admitting: *Deleted

## 2021-11-28 NOTE — Telephone Encounter (Signed)
Received critical recording on day 29 from [employer]   ?11/27/21 at 7:10 pm CST  sinus rhythm w/run of v-tach ( 8 beats) MF PVC's  (2 in 1 min) Per pt did yard work all afternoon and was carry luggage at the time of the recording no symptoms noted ./cy ?

## 2021-11-28 NOTE — Telephone Encounter (Signed)
Discussed with Dr Tamala Julian no change continue to monitor ./cy ?

## 2021-12-15 ENCOUNTER — Ambulatory Visit: Payer: Managed Care, Other (non HMO) | Admitting: Cardiology

## 2021-12-20 ENCOUNTER — Encounter: Payer: Self-pay | Admitting: Cardiology

## 2021-12-20 ENCOUNTER — Encounter: Payer: Self-pay | Admitting: *Deleted

## 2021-12-20 ENCOUNTER — Ambulatory Visit: Payer: Managed Care, Other (non HMO) | Admitting: Cardiology

## 2021-12-20 VITALS — BP 114/82 | HR 88 | Ht 68.5 in | Wt 172.6 lb

## 2021-12-20 DIAGNOSIS — I480 Paroxysmal atrial fibrillation: Secondary | ICD-10-CM

## 2021-12-20 NOTE — Progress Notes (Signed)
? ?Electrophysiology Office Note ? ? ?Date:  12/20/2021  ? ?ID:  Travis Cook, DOB 01-07-1961, MRN [id number] ? ?PCP:  [name], MD  ?Cardiologist:  [name] ?Primary Electrophysiologist:  Rasheedah Reis Meredith Leeds, MD   ? ?No chief complaint on file. ? ?  ?History of Present Illness: ?Travis Cook is a 61 y.o. male who is being seen today for the evaluation of atrial fibrillation at the request of Travis Cook. Presenting today for electrophysiology evaluation.   ? ?History significant for coronary artery disease status post CABG in 2010, paroxysmal atrial fibrillation which developed in 2018.  He is status post atrial fibrillation ablation 01/20/2018.  Unfortunately, he began having more episodes of atrial fibrillation.  He was traveling and noted on his smart watch.  He was drinking cold liquids.  He does have vagally mediated atrial fibrillation.  He converted to sinus rhythm the next day. ? ?Today, denies symptoms of palpitations, chest pain, shortness of breath, orthopnea, PND, lower extremity edema, claudication, dizziness, presyncope, syncope, bleeding, or neurologic sequela. The patient is tolerating medications without difficulties.  He has been having more frequent episodes of atrial fibrillation.  His episodes are quite short-lived.  He states that when he exercises, his episodes go away.  He feels that his episodes are triggered by drinking cold liquids, lack of sleep, dehydration. ? ? ?Past Medical History:  ?Diagnosis Date  ? Anxiety   ? CAD (coronary artery disease) 2010  ? a. s/p CABG  ? HYPERLIPIDEMIA 08/29/2007  ? NEPHROLITHIASIS, HX OF 08/29/2007  ? Persistent atrial fibrillation ([place])   ? a. previously on amiodarone - discontinued 2/2 opthalmology concerns b. s/p PVI 2019 Dr Curt Bears  ? ?Past Surgical History:  ?Procedure Laterality Date  ? ATRIAL FIBRILLATION ABLATION N/A 03/05/2018  ? Procedure: ATRIAL FIBRILLATION ABLATION;  Surgeon: [name], MD;  Location: [facility];   Service: Cardiovascular;  Laterality: N/A;  ? CARDIAC CATHETERIZATION    ? CARDIOVERSION N/A 12/12/2016  ? Procedure: CARDIOVERSION;  Surgeon: [name], MD;  Location: [facility];  Service: Cardiovascular;  Laterality: N/A;  ? CARDIOVERSION N/A 02/11/2018  ? Procedure: CARDIOVERSION;  Surgeon: [name], MD;  Location: [facility];  Service: Cardiovascular;  Laterality: N/A;  ? COLONOSCOPY    ? CORONARY ARTERY BYPASS GRAFT    ? 2010  ? heart bypass    ? 3 vessels 2010  ? INGUINAL HERNIA REPAIR Left 01/16/2014  ? Procedure:  LEFT INGUINAL HERNIA REPAIR WITH ON-Q PUMP PLACEMENT;  Surgeon: [name], MD;  Location: [facility];  Service: General;  Laterality: Left;  ? INSERTION OF MESH Left 01/16/2014  ? Procedure: INSERTION OF MESH;  Surgeon: [name], MD;  Location: [facility];  Service: General;  Laterality: Left;  ? lithotripsy 2005    ? POLYPECTOMY    ? ? ? ?Current Outpatient Medications  ?Medication Sig Dispense Refill  ? aspirin EC 81 MG tablet Take 81 mg by mouth daily.    ? atorvastatin (LIPITOR) 10 MG tablet Take 10 mg by mouth daily.    ? diltiazem (CARDIZEM) 30 MG tablet Take 1 tablet every 4 hours AS NEEDED for heart rate >100 30 tablet 1  ? metoprolol tartrate (LOPRESSOR) 25 MG tablet Take 1 tablet (25 mg total) by mouth as needed (for palpitations). 90 tablet 3  ? Multiple Vitamins-Minerals (MULTIVITAMIN WITH MINERALS) tablet Take 1 tablet by mouth daily.    ? nitroGLYCERIN (NITROSTAT) 0.4 MG SL tablet PLACE 1  TABLET (0.4 MG TOTAL) UNDER THE TONGUE EVERY 5 (FIVE) MINUTES AS NEEDED FOR CHEST PAIN. 25 tablet 3  ? rivaroxaban (XARELTO) 20 MG TABS tablet Take 20 mg by mouth daily as needed (AFIB).    ? zolpidem (AMBIEN) 10 MG tablet TAKE 1 TABLET BY MOUTH AT BEDTIME AS NEEDED FOR SLEEP 15 tablet 3  ? ?No current facility-administered medications for this visit.  ? ? ?Allergies:   Patient has no known allergies.  ? ?Social History:  The patient  reports that he has never smoked. He has never used  smokeless tobacco. He reports current alcohol use of about 5.0 - 6.0 standard drinks per week. He reports that he does not use drugs.  ? ?Family History:  The patient's family history includes Heart disease (age of onset: 106) in his father.  ? ?ROS:  Please see the history of present illness.   Otherwise, review of systems is positive for none.   All other systems are reviewed and negative.  ? ?PHYSICAL EXAM: ?VS:  BP 114/82   Pulse 88   Ht 5' 8.5" (1.74 m)   Wt 172 lb 9.6 oz (78.3 kg)   SpO2 98%   BMI 25.86 kg/m?  , BMI Body mass index is 25.86 kg/m?. ?GEN: Well nourished, well developed, in no acute distress  ?HEENT: normal  ?Neck: no JVD, carotid bruits, or masses ?Cardiac: RRR; no murmurs, rubs, or gallops,no edema  ?Respiratory:  clear to auscultation bilaterally, normal work of breathing ?GI: soft, nontender, nondistended, + BS ?MS: no deformity or atrophy  ?Skin: warm and dry ?Neuro:  Strength and sensation are intact ?Psych: euthymic mood, full affect ? ?EKG:  EKG is not ordered today. ?Personal review of the ekg ordered 10/10/21 shows sinus rhythm  ? ?Recent Labs: ?No results found for requested labs within last 8760 hours.  ? ? ?Lipid Panel  ?   ?Component Value Date/Time  ? CHOL 168 08/15/2013 1037  ? TRIG 101.0 08/15/2013 1037  ? TRIG 67 05/31/2006 1254  ? HDL 51.30 08/15/2013 1037  ? CHOLHDL 3 08/15/2013 1037  ? VLDL 20.2 08/15/2013 1037  ? [place] 97 08/15/2013 1037  ? ? ? ?Wt Readings from Last 3 Encounters:  ?12/20/21 172 lb 9.6 oz (78.3 kg)  ?11/01/21 171 lb (77.6 kg)  ?10/10/21 173 lb 12.8 oz (78.8 kg)  ?  ? ? ?Other studies Reviewed: ?Additional studies/ records that were reviewed today include: TTE 10/29/21 ?Review of the above records today demonstrates:  ? 1. Left ventricular ejection fraction, by estimation, is 60 to 65%. The  ?left ventricle has normal function. The left ventricle has no regional  ?wall motion abnormalities. There is mild concentric left ventricular  ?hypertrophy. Left  ventricular diastolic  ?parameters were normal.  ? 2. Right ventricular systolic function is normal. The right ventricular  ?size is normal. There is normal pulmonary artery systolic pressure.  ? 3. The mitral valve has been repaired/replaced. Trivial mitral valve  ?regurgitation. No evidence of mitral stenosis. There is a repair present  ?in the mitral position.  ? 4. The aortic valve is tricuspid. There is mild calcification of the  ?aortic valve. Aortic valve regurgitation is not visualized. No aortic  ?stenosis is present.  ? 5. The inferior vena cava is normal in size with greater than 50%  ?respiratory variability, suggesting right atrial pressure of 3 mmHg.  ? ?Cardiac monitor 12/06/2021 personally reviewed ?NSR ?Some prolonged episodes of PAF associated with dizziness ?PAC/PVC;s ?One episode wide complex  idioventricular rhythm < 10 beats ? ?ASSESSMENT AND PLAN: ? ?1.  Paroxysmal atrial fibrillation: Status post ablation 03/05/2018.  CHA2DS2-VASc of 1.  Currently on Xarelto 20 mg daily.  He is continued to have episodes of atrial fibrillation.  Fortunately his episodes are quite short.  We offered ablation versus medical management versus watchful waiting.  At this point, he is not quite ready for ablation.  We Jamarrius Salay continue to monitor his symptoms over the next 6 months and if more atrial fibrillation occurs, would be okay with ablation. ? ?2.  Coronary artery disease: Status post CABG.  Currently on aspirin and statin.  Plan per primary cardiology. ? ?3.  Hyperlipidemia: Statin per primary cardiology. ? ?4.  Mitral valve repair: Echo 10/27/2021 with stable valve per primary cardiology. ? ?Current medicines are reviewed at length with the patient today.   ?The patient does not have concerns regarding his medicines.  The following changes were made today: None ? ?Labs/ tests ordered today include:  ?No orders of the defined types were placed in this encounter. ? ?Disposition:   FU 6 months ? ?Signed, ?Zayda Angell  Meredith Leeds, MD  ?12/20/2021 11:20 AM    ? ?[facility] ?[address] ?[address] ?[place] [address] ?([phone] (office) ?[phone] (fax) ? ?

## 2021-12-20 NOTE — Patient Instructions (Signed)
Medication Instructions:  Your physician recommends that you continue on your current medications as directed. Please refer to the Current Medication list given to you today.  *If you need a refill on your cardiac medications before your next appointment, please call your pharmacy*   Lab Work: None ordered   Testing/Procedures: None ordered   Follow-Up: At [facility], you and your health needs are our priority.  As part of our continuing mission to provide you with exceptional heart care, we have created designated Provider Care Teams.  These Care Teams include your primary Cardiologist (physician) and Advanced Practice Providers (APPs -  Physician Assistants and Nurse Practitioners) who all work together to provide you with the care you need, when you need it.  Your next appointment:   6 month(s)  The format for your next appointment:   In Person  Provider:   Will Camnitz, MD    Thank you for choosing [facility]!!   Nuno Brubacher, RN [phone]  Other Instructions   Important Information About Sugar           

## 2022-01-27 ENCOUNTER — Other Ambulatory Visit: Payer: Self-pay | Admitting: Orthopedic Surgery

## 2022-01-27 DIAGNOSIS — G8929 Other chronic pain: Secondary | ICD-10-CM

## 2022-06-26 ENCOUNTER — Ambulatory Visit: Payer: Managed Care, Other (non HMO) | Attending: Cardiology | Admitting: Cardiology

## 2022-06-26 ENCOUNTER — Encounter: Payer: Self-pay | Admitting: Cardiology

## 2022-06-26 VITALS — BP 118/76 | HR 74 | Ht 68.5 in | Wt 177.8 lb

## 2022-06-26 DIAGNOSIS — I480 Paroxysmal atrial fibrillation: Secondary | ICD-10-CM

## 2022-06-26 DIAGNOSIS — I2510 Atherosclerotic heart disease of native coronary artery without angina pectoris: Secondary | ICD-10-CM | POA: Diagnosis not present

## 2022-06-26 NOTE — Patient Instructions (Signed)
Medication Instructions:  Your physician recommends that you continue on your current medications as directed. Please refer to the Current Medication list given to you today.  *If you need a refill on your cardiac medications before your next appointment, please call your pharmacy*   Lab Work: None ordered   Testing/Procedures: None ordered   Follow-Up: At [facility], you and your health needs are our priority.  As part of our continuing mission to provide you with exceptional heart care, we have created designated Provider Care Teams.  These Care Teams include your primary Cardiologist (physician) and Advanced Practice Providers (APPs -  Physician Assistants and Nurse Practitioners) who all work together to provide you with the care you need, when you need it.  Your next appointment:   1 year(s)  The format for your next appointment:   In Person  Provider:   Will Camnitz, MD    Thank you for choosing [facility]!!   Toriana Sponsel, RN [phone]  Other Instructions   Important Information About Sugar           

## 2022-06-26 NOTE — Progress Notes (Signed)
Electrophysiology Office Note   Date:  06/26/2022   ID:  Travis Cook, DOB 02-15-1961, MRN [id number]  PCP:  [name], MD  Cardiologist:  [name] Primary Electrophysiologist:  Allegra Lai, MD  No chief complaint on file.    History of Present Illness: Travis Cook is a 61 y.o. male who is being seen today for the evaluation of atrial fibrillation at the request of [name]. Presenting today for electrophysiology evaluation.    History significant for coronary artery disease status post CABG in 2010, paroxysmal atrial fibrillation which developed in 2018.  He is status post atrial fibrillation ablation 01/20/2018.    He does convert to AF rarely, usually lasts a couple minutes or hours, has not lasted more than a day. Describes his symptoms as "heart weirdness" and some dizziness. Notices episodes happening more often when he is traveling, dehydrated, or having increased stress. Has limited his flight times and made a more conscious effort to stay hydrated, especially when traveling. Exercise can also break his AF.  Today, denies symptoms of palpitations, chest pain, shortness of breath, orthopnea, PND, lower extremity edema, claudication, dizziness, presyncope, syncope, bleeding, or neurologic sequela. The patient is tolerating medications without difficulties.   Past Medical History:  Diagnosis Date   Anxiety    CAD (coronary artery disease) 2010   a. s/p CABG   HYPERLIPIDEMIA 08/29/2007   NEPHROLITHIASIS, HX OF 08/29/2007   Persistent atrial fibrillation (HCC)    a. previously on amiodarone - discontinued 2/2 opthalmology concerns b. s/p PVI 2019 Dr Curt Bears   Past Surgical History:  Procedure Laterality Date   ATRIAL FIBRILLATION ABLATION N/A 03/05/2018   Procedure: ATRIAL FIBRILLATION ABLATION;  Surgeon: [name], MD;  Location: [place] [facility];  Service: Cardiovascular;  Laterality: N/A;   CARDIAC CATHETERIZATION     CARDIOVERSION N/A 12/12/2016    Procedure: CARDIOVERSION;  Surgeon: [name], MD;  Location: [place];  Service: Cardiovascular;  Laterality: N/A;   CARDIOVERSION N/A 02/11/2018   Procedure: CARDIOVERSION;  Surgeon: [name], MD;  Location: [place];  Service: Cardiovascular;  Laterality: N/A;   COLONOSCOPY     CORONARY ARTERY BYPASS GRAFT     2010   heart bypass     3 vessels 2010   INGUINAL HERNIA REPAIR Left 01/16/2014   Procedure:  LEFT INGUINAL HERNIA REPAIR WITH ON-Q PUMP PLACEMENT;  Surgeon: [name], MD;  Location: [place];  Service: General;  Laterality: Left;   INSERTION OF MESH Left 01/16/2014   Procedure: INSERTION OF MESH;  Surgeon: [name], MD;  Location: [place];  Service: General;  Laterality: Left;   lithotripsy 2005     POLYPECTOMY       Current Outpatient Medications  Medication Sig Dispense Refill   aspirin EC 81 MG tablet Take 81 mg by mouth daily.     atorvastatin (LIPITOR) 10 MG tablet Take 10 mg by mouth daily.     diltiazem (CARDIZEM) 30 MG tablet Take 1 tablet every 4 hours AS NEEDED for heart rate >100 30 tablet 1   metoprolol tartrate (LOPRESSOR) 25 MG tablet Take 1 tablet (25 mg total) by mouth as needed (for palpitations). 90 tablet 3   Multiple Vitamins-Minerals (MULTIVITAMIN WITH MINERALS) tablet Take 1 tablet by mouth daily.     nitroGLYCERIN (NITROSTAT) 0.4 MG SL tablet PLACE 1 TABLET (0.4 MG TOTAL) UNDER THE TONGUE EVERY 5 (FIVE) MINUTES AS NEEDED FOR CHEST PAIN. 25 tablet 3  rivaroxaban (XARELTO) 20 MG TABS tablet Take 20 mg by mouth daily as needed (AFIB).     zolpidem (AMBIEN) 10 MG tablet TAKE 1 TABLET BY MOUTH AT BEDTIME AS NEEDED FOR SLEEP 15 tablet 3   No current facility-administered medications for this visit.    Allergies:   Patient has no known allergies.   Social History:  The patient  reports that he has never smoked. He has never used smokeless tobacco. He reports current alcohol use of about 5.0 - 6.0 standard drinks of alcohol per  week. He reports that he does not use drugs.   Family History:  The patient's family history includes Heart disease (age of onset: 53) in his father.   ROS:  Please see the history of present illness.   Otherwise, review of systems is positive for none.   All other systems are reviewed and negative.   PHYSICAL EXAM: VS:  BP 118/76   Pulse 74   Ht 5' 8.5" (1.74 m)   Wt 177 lb 12.8 oz (80.6 kg)   SpO2 98%   BMI 26.64 kg/m  , BMI Body mass index is 26.64 kg/m. GEN: Well nourished, well developed, in no acute distress  HEENT: normal  Neck: no JVD, carotid bruits, or masses Cardiac: RRR; no murmurs, rubs, or gallops,no edema  Respiratory:  clear to auscultation bilaterally, normal work of breathing GI: soft, nontender, nondistended, + BS MS: no deformity or atrophy  Skin: warm and dry, healed sternal incision Neuro:  Strength and sensation are intact Psych: euthymic mood, full affect  EKG:  EKG is ordered today. Personal review of the ekg ordered shows NSR, rate 74bpm.  Recent Labs: No results found for requested labs within last 365 days.    Lipid Panel     Component Value Date/Time   CHOL 168 08/15/2013 1037   TRIG 101.0 08/15/2013 1037   TRIG 67 05/31/2006 1254   HDL 51.30 08/15/2013 1037   CHOLHDL 3 08/15/2013 1037   VLDL 20.2 08/15/2013 1037   LDLCALC 97 08/15/2013 1037     Wt Readings from Last 3 Encounters:  06/26/22 177 lb 12.8 oz (80.6 kg)  12/20/21 172 lb 9.6 oz (78.3 kg)  11/01/21 171 lb (77.6 kg)      Other studies Reviewed: Additional studies/ records that were reviewed today include: TTE 10/29/21 Review of the above records today demonstrates:   1. Left ventricular ejection fraction, by estimation, is 60 to 65%. The  left ventricle has normal function. The left ventricle has no regional  wall motion abnormalities. There is mild concentric left ventricular  hypertrophy. Left ventricular diastolic  parameters were normal.   2. Right ventricular  systolic function is normal. The right ventricular  size is normal. There is normal pulmonary artery systolic pressure.   3. The mitral valve has been repaired/replaced. Trivial mitral valve  regurgitation. No evidence of mitral stenosis. There is a repair present  in the mitral position.   4. The aortic valve is tricuspid. There is mild calcification of the  aortic valve. Aortic valve regurgitation is not visualized. No aortic  stenosis is present.   5. The inferior vena cava is normal in size with greater than 50%  respiratory variability, suggesting right atrial pressure of 3 mmHg.   Cardiac monitor 12/06/2021 personally reviewed NSR Some prolonged episodes of PAF associated with dizziness PAC/PVC;s One episode wide complex idioventricular rhythm < 10 beats  ASSESSMENT AND PLAN:  1.  Paroxysmal atrial fibrillation:  S/p ablation  02/2018 CHA2DS2-VASc Score = 1 (Vasc Dz) AC - xarelto PRN when AF last longer than 24hours continuously Taking metop and dilt PRN, rarely Discussed continuing to monitor AF symptoms  2.  Coronary artery disease: Status post CABG.  Currently on aspirin and statin.  Plan per primary cardiology.  3.  Hyperlipidemia: Statin per primary cardiology.  4.  Mitral valve repair: Echo 10/27/2021 with stable valve per primary cardiology.  Current medicines are reviewed at length with the patient today.   The patient does not have concerns regarding his medicines.  The following changes were made today: None  Labs/ tests ordered today include:  Orders Placed This Encounter  Procedures   EKG 12-Lead   Disposition:   FU 1 year  Signed, [name], MD  06/26/2022 10:39 AM     [facility] [address] [place] [address] [phone] (office) [phone] (fax)  I have seen and examined this patient with Mamie Levers.  Agree with above, note added to reflect my findings.  He is currently feeling well.  He continues to  have minimal episodes of atrial fibrillation.  No complaints at this time.  GEN: Well nourished, well developed, in no acute distress  HEENT: normal  Neck: no JVD, carotid bruits, or masses Cardiac: RRR; no murmurs, rubs, or gallops,no edema  Respiratory:  clear to auscultation bilaterally, normal work of breathing GI: soft, nontender, nondistended, + BS MS: no deformity or atrophy  Skin: warm and dry Neuro:  Strength and sensation are intact Psych: euthymic mood, full affect   Paroxysmal atrial fibrillation: CHA2DS2-VASc of 1.  Currently not anticoagulated.  He does take Xarelto if episodes last greater than 24 hours.  He is fortunately had minimal episodes of atrial fibrillation and is happy with his control. Mitral valve repair: Stable on most recent echo.  Plan per primary cardiology. Coronary artery disease: Status post CABG.  No current chest pain.  Will M. Camnitz MD 06/26/2022 10:39 AM

## 2022-06-26 NOTE — Progress Notes (Signed)
Electrophysiology Office Note   Date:  06/26/2022   ID:  Travis Cook, DOB Dec 13, 1960, MRN [id number]  PCP:  [name], MD  Cardiologist:  [name] Primary Electrophysiologist:  Allegra Lai, MD  No chief complaint on file.    History of Present Illness: Travis Cook is a 61 y.o. male who is being seen today for the evaluation of atrial fibrillation at the request of [name]. Presenting today for electrophysiology evaluation.    History significant for coronary artery disease status post CABG in 2010, paroxysmal atrial fibrillation which developed in 2018.  He is status post atrial fibrillation ablation 01/20/2018.    He does convert to AF rarely, usually lasts a couple minutes or hours, has not lasted more than a day. Describes his symptoms as "heart weirdness" and some dizziness. Notices episodes happening more often when he is traveling, dehydrated, or having increased stress. Has limited his flight times and made a more conscious effort to stay hydrated, especially when traveling. Exercise can also break his AF.  Today, denies symptoms of palpitations, chest pain, shortness of breath, orthopnea, PND, lower extremity edema, claudication, dizziness, presyncope, syncope, bleeding, or neurologic sequela. The patient is tolerating medications without difficulties.   Past Medical History:  Diagnosis Date   Anxiety    CAD (coronary artery disease) 2010   a. s/p CABG   HYPERLIPIDEMIA 08/29/2007   NEPHROLITHIASIS, HX OF 08/29/2007   Persistent atrial fibrillation (HCC)    a. previously on amiodarone - discontinued 2/2 opthalmology concerns b. s/p PVI 2019 Dr Curt Bears   Past Surgical History:  Procedure Laterality Date   ATRIAL FIBRILLATION ABLATION N/A 03/05/2018   Procedure: ATRIAL FIBRILLATION ABLATION;  Surgeon: [name], MD;  Location: [place] [facility];  Service: Cardiovascular;  Laterality: N/A;   CARDIAC CATHETERIZATION     CARDIOVERSION N/A 12/12/2016    Procedure: CARDIOVERSION;  Surgeon: [name], MD;  Location: [place];  Service: Cardiovascular;  Laterality: N/A;   CARDIOVERSION N/A 02/11/2018   Procedure: CARDIOVERSION;  Surgeon: [name], MD;  Location: [place];  Service: Cardiovascular;  Laterality: N/A;   COLONOSCOPY     CORONARY ARTERY BYPASS GRAFT     2010   heart bypass     3 vessels 2010   INGUINAL HERNIA REPAIR Left 01/16/2014   Procedure:  LEFT INGUINAL HERNIA REPAIR WITH ON-Q PUMP PLACEMENT;  Surgeon: [name], MD;  Location: [place];  Service: General;  Laterality: Left;   INSERTION OF MESH Left 01/16/2014   Procedure: INSERTION OF MESH;  Surgeon: [name], MD;  Location: [place];  Service: General;  Laterality: Left;   lithotripsy 2005     POLYPECTOMY       Current Outpatient Medications  Medication Sig Dispense Refill   aspirin EC 81 MG tablet Take 81 mg by mouth daily.     atorvastatin (LIPITOR) 10 MG tablet Take 10 mg by mouth daily.     diltiazem (CARDIZEM) 30 MG tablet Take 1 tablet every 4 hours AS NEEDED for heart rate >100 30 tablet 1   metoprolol tartrate (LOPRESSOR) 25 MG tablet Take 1 tablet (25 mg total) by mouth as needed (for palpitations). 90 tablet 3   Multiple Vitamins-Minerals (MULTIVITAMIN WITH MINERALS) tablet Take 1 tablet by mouth daily.     nitroGLYCERIN (NITROSTAT) 0.4 MG SL tablet PLACE 1 TABLET (0.4 MG TOTAL) UNDER THE TONGUE EVERY 5 (FIVE) MINUTES AS NEEDED FOR CHEST PAIN. 25 tablet 3  rivaroxaban (XARELTO) 20 MG TABS tablet Take 20 mg by mouth daily as needed (AFIB).     zolpidem (AMBIEN) 10 MG tablet TAKE 1 TABLET BY MOUTH AT BEDTIME AS NEEDED FOR SLEEP 15 tablet 3   No current facility-administered medications for this visit.    Allergies:   Patient has no known allergies.   Social History:  The patient  reports that he has never smoked. He has never used smokeless tobacco. He reports current alcohol use of about 5.0 - 6.0 standard drinks of alcohol per  week. He reports that he does not use drugs.   Family History:  The patient's family history includes Heart disease (age of onset: 77) in his father.   ROS:  Please see the history of present illness.   Otherwise, review of systems is positive for none.   All other systems are reviewed and negative.   PHYSICAL EXAM: VS:  BP 118/76   Pulse 74   Ht 5' 8.5" (1.74 m)   Wt 177 lb 12.8 oz (80.6 kg)   SpO2 98%   BMI 26.64 kg/m  , BMI Body mass index is 26.64 kg/m. GEN: Well nourished, well developed, in no acute distress  HEENT: normal  Neck: no JVD, carotid bruits, or masses Cardiac: RRR; no murmurs, rubs, or gallops,no edema  Respiratory:  clear to auscultation bilaterally, normal work of breathing GI: soft, nontender, nondistended, + BS MS: no deformity or atrophy  Skin: warm and dry, healed sternal incision Neuro:  Strength and sensation are intact Psych: euthymic mood, full affect  EKG:  EKG is ordered today. Personal review of the ekg ordered shows NSR, rate 74bpm.  Recent Labs: No results found for requested labs within last 365 days.    Lipid Panel     Component Value Date/Time   CHOL 168 08/15/2013 1037   TRIG 101.0 08/15/2013 1037   TRIG 67 05/31/2006 1254   HDL 51.30 08/15/2013 1037   CHOLHDL 3 08/15/2013 1037   VLDL 20.2 08/15/2013 1037   LDLCALC 97 08/15/2013 1037     Wt Readings from Last 3 Encounters:  06/26/22 177 lb 12.8 oz (80.6 kg)  12/20/21 172 lb 9.6 oz (78.3 kg)  11/01/21 171 lb (77.6 kg)      Other studies Reviewed: Additional studies/ records that were reviewed today include: TTE 10/29/21 Review of the above records today demonstrates:   1. Left ventricular ejection fraction, by estimation, is 60 to 65%. The  left ventricle has normal function. The left ventricle has no regional  wall motion abnormalities. There is mild concentric left ventricular  hypertrophy. Left ventricular diastolic  parameters were normal.   2. Right ventricular  systolic function is normal. The right ventricular  size is normal. There is normal pulmonary artery systolic pressure.   3. The mitral valve has been repaired/replaced. Trivial mitral valve  regurgitation. No evidence of mitral stenosis. There is a repair present  in the mitral position.   4. The aortic valve is tricuspid. There is mild calcification of the  aortic valve. Aortic valve regurgitation is not visualized. No aortic  stenosis is present.   5. The inferior vena cava is normal in size with greater than 50%  respiratory variability, suggesting right atrial pressure of 3 mmHg.   Cardiac monitor 12/06/2021 personally reviewed NSR Some prolonged episodes of PAF associated with dizziness PAC/PVC;s One episode wide complex idioventricular rhythm < 10 beats  ASSESSMENT AND PLAN:  1.  Paroxysmal atrial fibrillation:  S/p ablation  02/2018 CHA2DS2-VASc Score = 1 (Vasc Dz) AC - xarelto PRN when AF last longer than 24hours continuously Taking metop and dilt PRN, rarely Discussed continuing to monitor AF symptoms  2.  Coronary artery disease: Status post CABG.  Currently on aspirin and statin.  Plan per primary cardiology.  3.  Hyperlipidemia: Statin per primary cardiology.  4.  Mitral valve repair: Echo 10/27/2021 with stable valve per primary cardiology.  Current medicines are reviewed at length with the patient today.   The patient does not have concerns regarding his medicines.  The following changes were made today: None  Labs/ tests ordered today include:  Orders Placed This Encounter  Procedures   EKG 12-Lead   Disposition:   FU 1 year  Signed, Halla Chopp Meredith Leeds, MD  06/26/2022 10:40 AM     [facility] [address] [place] [address] [phone] (office) [phone] (fax)  I have seen and examined this patient with Mamie Levers.  Agree with above, note added to reflect my findings.  He is currently feeling well.  He continues to  have minimal episodes of atrial fibrillation.  No complaints at this time.  GEN: Well nourished, well developed, in no acute distress  HEENT: normal  Neck: no JVD, carotid bruits, or masses Cardiac: RRR; no murmurs, rubs, or gallops,no edema  Respiratory:  clear to auscultation bilaterally, normal work of breathing GI: soft, nontender, nondistended, + BS MS: no deformity or atrophy  Skin: warm and dry Neuro:  Strength and sensation are intact Psych: euthymic mood, full affect   Paroxysmal atrial fibrillation: CHA2DS2-VASc of 1.  Currently not anticoagulated.  He does take Xarelto if episodes last greater than 24 hours.  He is fortunately had minimal episodes of atrial fibrillation and is happy with his control. Mitral valve repair: Stable on most recent echo.  Plan per primary cardiology. Coronary artery disease: Status post CABG.  No current chest pain.  Brock Mokry M. Yvonna Brun MD 06/26/2022 10:40 AM

## 2023-04-12 ENCOUNTER — Telehealth: Payer: Self-pay | Admitting: Cardiovascular Disease

## 2023-04-12 NOTE — Telephone Encounter (Signed)
Dr. Susann Givens did a physical on the pt and he thinks it is a sinus arhythmia. The Dr is worried the pt might be in AFIB. Please advise

## 2023-04-12 NOTE — Telephone Encounter (Signed)
Aware received fax w/ the EKG. Aware in Dr. Gershon Crane basket to review/advise.

## 2024-01-04 NOTE — Progress Notes (Signed)
 Patient ID: Travis Cook, male   DOB: 16-Jan-1961, 63 y.o.   MRN: [id number]     63 y.o. CABG 06/2009 Travis Cook [name] to [name] [name] OM and [name] [name].with MV repair.  Developed PAF  11/03/16 CHA2DS2 1 DCC 12/12/16 reverted back 01/24/17 started on amiodarone  with conversion. 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 Did have some prolonged episodes of PAF seen by Travis Cook and only recoomended cardizem , lopressor  and xarelto  when afib starts   Discussed vagal inputs triggering events. He thinks cardizem  made him feel worse Discussed PRN Loporessor  TTE ok MV repair fine EF normal no changes   Active no PAF. 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  All other systems reviewed and negative  General: There were no vitals taken for this visit. Affect appropriate Healthy:  appears stated age HEENT: normal Neck supple with no adenopathy JVP normal no bruits no thyromegaly Lungs clear with no wheezing and good diaphragmatic  motion Heart:  S1/S2 no murmur, no rub, gallop or click PMI normal post sternotomy  Abdomen: benighn, BS positve, no tenderness, no AAA no bruit.  No HSM or HJR Distal pulses intact with no bruits No edema Neuro non-focal Skin warm and dry No muscular weakness   Current Outpatient Medications  Medication Sig Dispense Refill   aspirin  EC 81 MG tablet Take 81 mg by mouth daily.     atorvastatin  (LIPITOR) 10 MG tablet Take 10 mg by mouth daily.     diltiazem  (CARDIZEM ) 30 MG tablet Take 1 tablet every 4 hours AS NEEDED for heart rate >100 30 tablet 1   metoprolol  tartrate (LOPRESSOR ) 25 MG tablet Take 1 tablet (25 mg total) by mouth as needed (for palpitations). 90 tablet 3   Multiple Vitamins-Minerals (MULTIVITAMIN WITH MINERALS) tablet Take 1 tablet by mouth daily.     nitroGLYCERIN  (NITROSTAT ) 0.4 MG SL tablet PLACE 1 TABLET (0.4 MG TOTAL) UNDER THE TONGUE EVERY 5 (FIVE) MINUTES AS NEEDED FOR CHEST PAIN. 25 tablet 3   rivaroxaban  (XARELTO ) 20 MG TABS tablet Take 20 mg by mouth daily as needed (AFIB).     zolpidem  (AMBIEN ) 10 MG tablet TAKE 1 TABLET BY MOUTH AT BEDTIME AS NEEDED FOR SLEEP 15 tablet 3   No current facility-administered medications for this visit.    Allergies  Patient has no known allergies.  Electrocardiogram: 04/12/23 SR normal   Assessment and Plan  CAD/CABG:  2010  Stable no angina continue ASA/Statin  Baseline  HR low so no beta blocker. 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  Tends to have PAF when traveling and dehydrated   MV  Repair:  Echo 10/27/21 with trivial residual MR SBE prophylaxis stable      F/U 6 months Travis Cook and me in a year   [employer]

## 2024-01-18 ENCOUNTER — Encounter: Payer: Self-pay | Admitting: Cardiovascular Disease

## 2024-01-18 ENCOUNTER — Ambulatory Visit: Attending: Cardiovascular Disease | Admitting: Cardiovascular Disease

## 2024-01-18 VITALS — BP 112/80 | HR 88 | Ht 69.0 in | Wt 171.0 lb

## 2024-01-18 DIAGNOSIS — Z951 Presence of aortocoronary bypass graft: Secondary | ICD-10-CM | POA: Diagnosis not present

## 2024-01-18 DIAGNOSIS — I2510 Atherosclerotic heart disease of native coronary artery without angina pectoris: Secondary | ICD-10-CM | POA: Diagnosis not present

## 2024-01-18 DIAGNOSIS — I480 Paroxysmal atrial fibrillation: Secondary | ICD-10-CM | POA: Diagnosis not present

## 2024-01-18 MED ORDER — NITROGLYCERIN 0.4 MG SL SUBL: 0.4000 mg | SUBLINGUAL_TABLET | SUBLINGUAL | 3 refills | Status: AC | PRN

## 2024-01-18 NOTE — Patient Instructions (Addendum)
 Medication Instructions:  Your physician recommends that you continue on your current medications as directed. Please refer to the Current Medication list given to you today.  *If you need a refill on your cardiac medications before your next appointment, please call your pharmacy*  Follow-Up: At [facility], you and your health needs are our priority.  As part of our continuing mission to provide you with exceptional heart care, our providers are all part of one team.  This team includes your primary Cardiologist (physician) and Advanced Practice Providers or APPs (Physician Assistants and Nurse Practitioners) who all work together to provide you with the care you need, when you need it.  Your next appointment:   6 month(s) with Dr. Lawana Pray 1 year with Dr. Stann Earnest  Provider:   Janelle Mediate, MD  We recommend signing up for the patient portal called "MyChart".  Sign up information is provided on this After Visit Summary.  MyChart is used to connect with patients for Virtual Visits (Telemedicine).  Patients are able to view lab/test results, encounter notes, upcoming appointments, etc.  Non-urgent messages can be sent to your provider as well.   To learn more about what you can do with MyChart, go to [email or web address].
